# Patient Record
Sex: Male | Born: 1975 | Race: White | Hispanic: No | Marital: Married | Smoking: Former smoker
Health system: Southern US, Community
[De-identification: ages and names within clinical notes are randomized; demographics above are authoritative.]

## PROBLEM LIST (undated history)

## (undated) DIAGNOSIS — T7840XA Allergy, unspecified, initial encounter: Secondary | ICD-10-CM

## (undated) DIAGNOSIS — F419 Anxiety disorder, unspecified: Secondary | ICD-10-CM

## (undated) DIAGNOSIS — K219 Gastro-esophageal reflux disease without esophagitis: Secondary | ICD-10-CM

## (undated) HISTORY — DX: Gastro-esophageal reflux disease without esophagitis: K21.9

## (undated) HISTORY — DX: Allergy, unspecified, initial encounter: T78.40XA

## (undated) HISTORY — PX: FRACTURE SURGERY: SHX138

## (undated) HISTORY — DX: Anxiety disorder, unspecified: F41.9

---

## 2015-09-18 ENCOUNTER — Ambulatory Visit: Payer: Self-pay | Admitting: Physician Assistant

## 2015-09-21 ENCOUNTER — Ambulatory Visit (INDEPENDENT_AMBULATORY_CARE_PROVIDER_SITE_OTHER): Payer: BLUE CROSS/BLUE SHIELD | Admitting: Physician Assistant

## 2015-09-21 ENCOUNTER — Encounter: Payer: Self-pay | Admitting: Physician Assistant

## 2015-09-21 VITALS — BP 127/70 | HR 89 | Ht 67.0 in | Wt 208.0 lb

## 2015-09-21 DIAGNOSIS — K219 Gastro-esophageal reflux disease without esophagitis: Secondary | ICD-10-CM

## 2015-09-21 DIAGNOSIS — K649 Unspecified hemorrhoids: Secondary | ICD-10-CM

## 2015-09-21 DIAGNOSIS — F410 Panic disorder [episodic paroxysmal anxiety] without agoraphobia: Secondary | ICD-10-CM

## 2015-09-21 DIAGNOSIS — J302 Other seasonal allergic rhinitis: Secondary | ICD-10-CM

## 2015-09-21 DIAGNOSIS — J309 Allergic rhinitis, unspecified: Secondary | ICD-10-CM | POA: Insufficient documentation

## 2015-09-21 DIAGNOSIS — Z8709 Personal history of other diseases of the respiratory system: Secondary | ICD-10-CM

## 2015-09-21 MED ORDER — OMEPRAZOLE 40 MG PO CPDR: 40.0000 mg | DELAYED_RELEASE_CAPSULE | Freq: Every day | ORAL | Status: DC

## 2015-09-21 MED ORDER — ALPRAZOLAM 1 MG PO TABS: 1.0000 mg | ORAL_TABLET | Freq: Every day | ORAL | Status: DC

## 2015-09-21 MED ORDER — LEVOCETIRIZINE DIHYDROCHLORIDE 5 MG PO TABS: 5.0000 mg | ORAL_TABLET | Freq: Every evening | ORAL | Status: DC

## 2015-09-21 MED ORDER — OLOPATADINE HCL 0.1 % OP SOLN: 1.0000 [drp] | Freq: Two times a day (BID) | OPHTHALMIC | Status: DC

## 2015-09-21 NOTE — Progress Notes (Signed)
   Subjective:    Patient ID: Luke KillSteven Villarreal, male    DOB: 11/15/1975, 40 y.o.   MRN: [id number]  HPI Pt is a 40 yo male who presents to the clinic to establish care.  He needs medication refills.   He would like new rx for allergic eye drops and allergy pill. He has tried OTC zyrtec, claritin and allegra.  He does discussed hemorrhoids. Had colonoscopy this year and banding done. Not bothering him today.   Panic attacks started about 1 year ago. Xanax at bedtime controlls symptoms.    .. Active Ambulatory Problems    Diagnosis Date Noted  . Gastroesophageal reflux disease without esophagitis 09/21/2015  . Rhinitis, allergic 09/21/2015  . Panic attacks 09/21/2015  . Seasonal allergies 09/21/2015  . Hemorrhoid 09/21/2015  . History of asthma 09/21/2015   Resolved Ambulatory Problems    Diagnosis Date Noted  . No Resolved Ambulatory Problems   No Additional Past Medical History   .Marland Kitchen. Family History  Problem Relation Age of Onset  . Hyperlipidemia Mother   . Hypertension Mother   . Hypertension Father   . Hyperlipidemia Father    .Marland Kitchen. Social History   Social History  . Marital Status: Married    Spouse Name: N/A  . Number of Children: N/A  . Years of Education: N/A   Occupational History  . Not on file.   Social History Main Topics  . Smoking status: Former Games developermoker  . Smokeless tobacco: Not on file  . Alcohol Use: No  . Drug Use: Not on file  . Sexual Activity: Yes   Other Topics Concern  . Not on file   Social History Narrative  . No narrative on file      Review of Systems  All other systems reviewed and are negative.      Objective:   Physical Exam  Constitutional: He is oriented to person, place, and time. He appears well-developed and well-nourished.  Obese.   HENT:  Head: Normocephalic and atraumatic.  Cardiovascular: Normal rate, regular rhythm and normal heart sounds.   Pulmonary/Chest: Effort normal and breath sounds normal.  Neurological:  He is alert and oriented to person, place, and time.  Skin: Skin is dry.  Psychiatric: He has a normal mood and affect. His behavior is normal.          Assessment & Plan:  Panic attacks- xanax at bedtime refilled.   Allergies/allergic conjunctivitis/hx of asthma- will try xyzal, patanol drops for allergies.   GERD- refilled prilosec.   Hemorrhoids- managed by GI. Hx of surgery. Has symptomatic care at home. Controlled today.

## 2015-09-22 ENCOUNTER — Encounter: Payer: Self-pay | Admitting: Physician Assistant

## 2015-09-30 ENCOUNTER — Ambulatory Visit: Payer: BLUE CROSS/BLUE SHIELD | Admitting: Family Medicine

## 2015-11-25 ENCOUNTER — Encounter: Payer: Self-pay | Admitting: Physician Assistant

## 2015-11-25 ENCOUNTER — Ambulatory Visit (INDEPENDENT_AMBULATORY_CARE_PROVIDER_SITE_OTHER): Payer: BLUE CROSS/BLUE SHIELD

## 2015-11-25 ENCOUNTER — Ambulatory Visit (INDEPENDENT_AMBULATORY_CARE_PROVIDER_SITE_OTHER): Payer: BLUE CROSS/BLUE SHIELD | Admitting: Physician Assistant

## 2015-11-25 VITALS — BP 122/73 | HR 77 | Ht 67.0 in | Wt 211.0 lb

## 2015-11-25 DIAGNOSIS — R319 Hematuria, unspecified: Secondary | ICD-10-CM | POA: Diagnosis not present

## 2015-11-25 DIAGNOSIS — R102 Pelvic and perineal pain: Secondary | ICD-10-CM | POA: Insufficient documentation

## 2015-11-25 DIAGNOSIS — M4317 Spondylolisthesis, lumbosacral region: Secondary | ICD-10-CM

## 2015-11-25 DIAGNOSIS — M546 Pain in thoracic spine: Secondary | ICD-10-CM

## 2015-11-25 DIAGNOSIS — Z13220 Encounter for screening for lipoid disorders: Secondary | ICD-10-CM

## 2015-11-25 DIAGNOSIS — R109 Unspecified abdominal pain: Secondary | ICD-10-CM

## 2015-11-25 DIAGNOSIS — K649 Unspecified hemorrhoids: Secondary | ICD-10-CM

## 2015-11-25 DIAGNOSIS — R1031 Right lower quadrant pain: Secondary | ICD-10-CM

## 2015-11-25 DIAGNOSIS — M549 Dorsalgia, unspecified: Secondary | ICD-10-CM

## 2015-11-25 DIAGNOSIS — G5603 Carpal tunnel syndrome, bilateral upper limbs: Secondary | ICD-10-CM | POA: Insufficient documentation

## 2015-11-25 DIAGNOSIS — R5383 Other fatigue: Secondary | ICD-10-CM

## 2015-11-25 LAB — POCT URINALYSIS DIPSTICK
Bilirubin, UA: NEGATIVE
Glucose, UA: NEGATIVE
Ketones, UA: NEGATIVE
Nitrite, UA: NEGATIVE
Protein, UA: NEGATIVE
Spec Grav, UA: 1.025
Urobilinogen, UA: 0.2
pH, UA: 6

## 2015-11-25 MED ORDER — MELOXICAM 15 MG PO TABS: 15.0000 mg | ORAL_TABLET | Freq: Every day | ORAL | 1 refills | Status: DC

## 2015-11-25 MED ORDER — KETOROLAC TROMETHAMINE 60 MG/2ML IM SOLN
60.0000 mg | Freq: Once | INTRAMUSCULAR | Status: AC
  Administered 2015-11-25: 60 mg via INTRAMUSCULAR

## 2015-11-25 MED ORDER — CYCLOBENZAPRINE HCL 10 MG PO TABS: 10.0000 mg | ORAL_TABLET | Freq: Three times a day (TID) | ORAL | 0 refills | Status: DC | PRN

## 2015-11-25 NOTE — Progress Notes (Addendum)
   Subjective:    Patient ID: Luke Villarreal, male    DOB: 1975-10-30, 40 y.o.   MRN: [id number]  HPI Pt is a 40 year old male that presents with complaints of back pain and hand swelling. He reports upper back pain that has been constant for the last month. He states the pain is made worse with movement but does not radiate. He has not tried anything for the pain. He reports right groin pain for two weeks that radiates to his toes. He denies any injury, trauma, fall, heavy lifting.   He complains of hand numbness and tingling for the last month. He has a job that requires repetitive motion. Seems to be a little worse in the morning.   He also complains of chronic fatigue. He states that he sleeps at night and is not waking up after falling asleep. He has been told that he snores. He reports that he never fills rested.    Review of Systems  All other systems reviewed and are negative.      Objective:   Physical Exam  Constitutional: He is oriented to person, place, and time. He appears well-developed and well-nourished.  Neck: Normal range of motion. Neck supple.  Cardiovascular: Normal rate, regular rhythm and normal heart sounds.   Pulmonary/Chest: Effort normal and breath sounds normal.  No CVA tenderness noted  Abdominal: Soft. Bowel sounds are normal. There is no tenderness.  Musculoskeletal: Normal range of motion.  No spine tenderness to palpation. Bilateral point tenderness over upper back muscles.  Full ROM.   Neurological: He is alert and oriented to person, place, and time. He has normal reflexes.  Positive Phalen's test and Tinel's sign    Skin: Skin is warm and dry.  Psychiatric: He has a normal mood and affect. His behavior is normal.          Assessment & Plan:  Acute upper back pain- Encourage therapeutic massage and use of Biofreeze. Start NSAID, Mobic, to decrease inflammation. Given Flexeril to use a bedtime. Handout on upper body exercise given today. Toradol  IM 60 mg given in clinic today. Follow up as needed if symptoms persist or worsen.    Bilateral carpal tunnel syndrome- Given hand splints for use at night. Will consider nerve conduction studies if symptoms fail to improve. mobic to start daily.   Chronic fatigue/snoring- STOP BANG was strongly suggestive of high risk sleep apnea. Will order sleep study. Screening CBC, TSH, CMP, Vitamin D, Vitamin B12, lipids, and ferrtin ordered.    Groin pain- Lumbar x-ray ordered. Encouraged heat and ice for symptomatic relief.   Hematuria- Unlikely due to kidney stone or infectious cause. Will repeat UA in 4 weeks.

## 2015-11-25 NOTE — Progress Notes (Addendum)
   Subjective:    Patient ID: Luke Villarreal, male    DOB: [birth year], 40 y.o.   MRN: [id number]  HPI    Review of Systems     Objective:   Physical Exam        Assessment & Plan:

## 2015-11-25 NOTE — Patient Instructions (Addendum)
Upper back exercises.  mobic daily.  Wrist splints at night.  Get labs.  Will order sleep study.   Carpal Tunnel Syndrome Carpal tunnel syndrome is a condition that causes pain in your hand and arm. The carpal tunnel is a narrow area located on the palm side of your wrist. Repeated wrist motion or certain diseases may cause swelling within the tunnel. This swelling pinches the main nerve in the wrist (median nerve). CAUSES  This condition may be caused by:   Repeated wrist motions.  Wrist injuries.  Arthritis.  A cyst or tumor in the carpal tunnel.  Fluid buildup during pregnancy. Sometimes the cause of this condition is not known.  RISK FACTORS This condition is more likely to develop in:   People who have jobs that cause them to repeatedly move their wrists in the same motion, such as butchers and cashiers.  Women.  People with certain conditions, such as:  Diabetes.  Obesity.  An underactive thyroid (hypothyroidism).  Kidney failure. SYMPTOMS  Symptoms of this condition include:   A tingling feeling in your fingers, especially in your thumb, index, and middle fingers.  Tingling or numbness in your hand.  An aching feeling in your entire arm, especially when your wrist and elbow are bent for long periods of time.  Wrist pain that goes up your arm to your shoulder.  Pain that goes down into your palm or fingers.  A weak feeling in your hands. You may have trouble grabbing and holding items. Your symptoms may feel worse during the night.  DIAGNOSIS  This condition is diagnosed with a medical history and physical exam. You may also have tests, including:   An electromyogram (EMG). This test measures electrical signals sent by your nerves into the muscles.  X-rays. TREATMENT  Treatment for this condition includes:  Lifestyle changes. It is important to stop doing or modify the activity that caused your condition.  Physical or occupational  therapy.  Medicines for pain and inflammation. This may include medicine that is injected into your wrist.  A wrist splint.  Surgery. HOME CARE INSTRUCTIONS  If You Have a Splint:  Wear it as told by your health care provider. Remove it only as told by your health care provider.  Loosen the splint if your fingers become numb and tingle, or if they turn cold and blue.  Keep the splint clean and dry. General Instructions  Take over-the-counter and prescription medicines only as told by your health care provider.  Rest your wrist from any activity that may be causing your pain. If your condition is work related, talk to your employer about changes that can be made, such as getting a wrist pad to use while typing.  If directed, apply ice to the painful area:  Put ice in a plastic bag.  Place a towel between your skin and the bag.  Leave the ice on for 20 minutes, 2-3 times per day.  Keep all follow-up visits as told by your health care provider. This is important.  Do any exercises as told by your health care provider, physical therapist, or occupational therapist. SEEK MEDICAL CARE IF:   You have new symptoms.  Your pain is not controlled with medicines.  Your symptoms get worse.   This information is not intended to replace advice given to you by your health care provider. Make sure you discuss any questions you have with your health care provider.   Document Released: 04/15/2000 Document Revised: 01/07/2015 Document Reviewed:  09/03/2014 Elsevier Interactive Patient Education [employer]2016 Elsevier Inc.

## 2015-11-26 ENCOUNTER — Encounter: Payer: Self-pay | Admitting: Physician Assistant

## 2015-11-26 DIAGNOSIS — E786 Lipoprotein deficiency: Secondary | ICD-10-CM | POA: Insufficient documentation

## 2015-11-26 DIAGNOSIS — E781 Pure hyperglyceridemia: Secondary | ICD-10-CM | POA: Insufficient documentation

## 2015-11-26 DIAGNOSIS — M4310 Spondylolisthesis, site unspecified: Secondary | ICD-10-CM | POA: Insufficient documentation

## 2015-11-26 LAB — COMPLETE METABOLIC PANEL WITH GFR
ALBUMIN: 4.1 g/dL (ref 3.6–5.1)
ALK PHOS: 47 U/L (ref 40–115)
ALT: 18 U/L (ref 9–46)
AST: 16 U/L (ref 10–40)
BUN: 13 mg/dL (ref 7–25)
CHLORIDE: 103 mmol/L (ref 98–110)
CO2: 27 mmol/L (ref 20–31)
Calcium: 8.9 mg/dL (ref 8.6–10.3)
Creat: 1.01 mg/dL (ref 0.60–1.35)
GFR, Est African American: >89 mL/min (ref >=60)
GLUCOSE: 90 mg/dL (ref 65–99)
POTASSIUM: 4.2 mmol/L (ref 3.5–5.3)
SODIUM: 140 mmol/L (ref 135–146)
Total Bilirubin: 0.7 mg/dL (ref 0.2–1.2)
Total Protein: 7.2 g/dL (ref 6.1–8.1)

## 2015-11-26 LAB — CBC WITH DIFFERENTIAL/PLATELET
BASOS ABS: 0 {cells}/uL (ref 0–200)
Basophils Relative: 0 %
EOS PCT: 2 %
Eosinophils Absolute: 132 cells/uL (ref 15–500)
HEMATOCRIT: 44.2 % (ref 38.5–50.0)
HEMOGLOBIN: 14.9 g/dL (ref 13.2–17.1)
LYMPHS ABS: 1782 {cells}/uL (ref 850–3900)
Lymphocytes Relative: 27 %
MCH: 29.9 pg (ref 27.0–33.0)
MCHC: 33.7 g/dL (ref 32.0–36.0)
MCV: 88.8 fL (ref 80.0–100.0)
MPV: 10.1 fL (ref 7.5–12.5)
Monocytes Absolute: 792 cells/uL (ref 200–950)
Monocytes Relative: 12 %
NEUTROS ABS: 3894 {cells}/uL (ref 1500–7800)
Neutrophils Relative %: 59 %
Platelets: 222 10*3/uL (ref 140–400)
RBC: 4.98 MIL/uL (ref 4.20–5.80)
RDW: 14 % (ref 11.0–15.0)
WBC: 6.6 10*3/uL (ref 3.8–10.8)

## 2015-11-26 LAB — TSH: TSH: 2.22 mIU/L (ref 0.40–4.50)

## 2015-11-26 LAB — LIPID PANEL
Cholesterol: 201 mg/dL — ABNORMAL HIGH (ref 125–200)
HDL: 36 mg/dL — AB (ref >=40)
LDL CALC: 100 mg/dL (ref <130)
TRIGLYCERIDES: 327 mg/dL — AB (ref <150)
Total CHOL/HDL Ratio: 5.6 Ratio — ABNORMAL HIGH (ref <=5.0)
VLDL: 65 mg/dL — AB (ref <30)

## 2015-11-26 LAB — FERRITIN: Ferritin: 122 ng/mL (ref 20–345)

## 2015-11-26 LAB — VITAMIN B12: Vitamin B-12: 390 pg/mL (ref 200–1100)

## 2015-11-26 LAB — VITAMIN D 25 HYDROXY (VIT D DEFICIENCY, FRACTURES): VIT D 25 HYDROXY: 31 ng/mL (ref 30–100)

## 2015-11-27 LAB — URINE CULTURE: Organism ID, Bacteria: NO GROWTH

## 2015-12-02 ENCOUNTER — Ambulatory Visit: Payer: BLUE CROSS/BLUE SHIELD | Admitting: Physician Assistant

## 2015-12-23 ENCOUNTER — Ambulatory Visit: Payer: BLUE CROSS/BLUE SHIELD | Admitting: Physician Assistant

## 2015-12-28 ENCOUNTER — Encounter: Payer: Self-pay | Admitting: Physician Assistant

## 2015-12-28 ENCOUNTER — Ambulatory Visit (INDEPENDENT_AMBULATORY_CARE_PROVIDER_SITE_OTHER): Payer: BLUE CROSS/BLUE SHIELD | Admitting: Physician Assistant

## 2015-12-28 VITALS — BP 133/77 | HR 85 | Ht 67.0 in | Wt 214.0 lb

## 2015-12-28 DIAGNOSIS — E559 Vitamin D deficiency, unspecified: Secondary | ICD-10-CM

## 2015-12-28 DIAGNOSIS — R5383 Other fatigue: Secondary | ICD-10-CM | POA: Diagnosis not present

## 2015-12-28 DIAGNOSIS — E538 Deficiency of other specified B group vitamins: Secondary | ICD-10-CM

## 2015-12-28 MED ORDER — CYCLOBENZAPRINE HCL 10 MG PO TABS: 10.0000 mg | ORAL_TABLET | Freq: Three times a day (TID) | ORAL | 0 refills | Status: DC | PRN

## 2015-12-28 MED ORDER — CYANOCOBALAMIN 1000 MCG/ML IJ SOLN
1000.0000 ug | Freq: Once | INTRAMUSCULAR | Status: AC
  Administered 2015-12-28: 1000 ug via INTRAMUSCULAR

## 2015-12-30 ENCOUNTER — Encounter: Payer: Self-pay | Admitting: Physician Assistant

## 2015-12-30 NOTE — Progress Notes (Signed)
   Subjective:    Patient ID: Luke Villarreal, male    DOB: 07/07/1975, 40 y.o.   MRN: [id number]  HPI Pt is a 40 yo male With a history of psych issues(anxiety, bipolar, panic)Who presents to the clinic to follow-up after hospital visit on 12/14/2015 for weakness and no energy. Due to his history of tick bite they tested for documented spotted fever, Lyme's disease, CMP, CBC and EKG. Her labs and testing were negative. They did go ahead and give him doxycycline to take. He has been on B12 for about a week. He does seem to be fill in some better since the hospital visit and starting B12. He is back to work.   Review of Systems See HPI.     Objective:   Physical Exam  Constitutional: He is oriented to person, place, and time. He appears well-developed and well-nourished.  HENT:  Head: Normocephalic and atraumatic.  Cardiovascular: Normal rate, regular rhythm and normal heart sounds.   Pulmonary/Chest: Effort normal and breath sounds normal.  Neurological: He is alert and oriented to person, place, and time.  Psychiatric: He has a normal mood and affect. His behavior is normal.          Assessment & Plan:  No energy/weakness- seems to be improving some with b12 orally. Only done for 1 week. Will try injection. Reassured labs were normal at ED. Discussed checking testosterone at next blood draw.   b12 deficiency/vitamin D insufficiency- will recheck in 4 weeks. b12 shot given in office today to see if he gets more benefit. Continue vitamin D.

## 2016-01-29 ENCOUNTER — Ambulatory Visit (INDEPENDENT_AMBULATORY_CARE_PROVIDER_SITE_OTHER): Payer: BLUE CROSS/BLUE SHIELD | Admitting: Family Medicine

## 2016-01-29 VITALS — BP 141/93 | HR 86

## 2016-01-29 DIAGNOSIS — R5383 Other fatigue: Secondary | ICD-10-CM | POA: Diagnosis not present

## 2016-01-29 DIAGNOSIS — E538 Deficiency of other specified B group vitamins: Secondary | ICD-10-CM

## 2016-01-29 MED ORDER — CYANOCOBALAMIN 1000 MCG/ML IJ SOLN
1000.0000 ug | Freq: Once | INTRAMUSCULAR | Status: AC
  Administered 2016-01-29: 1000 ug via INTRAMUSCULAR

## 2016-01-29 NOTE — Progress Notes (Signed)
Luke Villarreal presents to the clinic for his monthly B 12 injection.  Denies negative side effects from last injection. He stated that he noticed a huge improvement his energy levels. Also he got his lab work done today. Pt tolerated injection well in the left deltoid.  Pt advised to make next appointment in 4 weeks. Pt verbalized understanding. -EMH/RMA

## 2016-01-29 NOTE — Progress Notes (Signed)
Agree with above.  Luke Metivier, MD  

## 2016-01-30 LAB — TESTOSTERONE: Testosterone: 186 ng/dL — ABNORMAL LOW (ref 250–827)

## 2016-01-30 LAB — VITAMIN B12: Vitamin B-12: 379 pg/mL (ref 200–1100)

## 2016-01-30 LAB — VITAMIN D 25 HYDROXY (VIT D DEFICIENCY, FRACTURES): Vit D, 25-Hydroxy: 27 ng/mL — ABNORMAL LOW (ref 30–100)

## 2016-02-26 ENCOUNTER — Ambulatory Visit: Payer: BLUE CROSS/BLUE SHIELD

## 2016-03-07 ENCOUNTER — Ambulatory Visit (INDEPENDENT_AMBULATORY_CARE_PROVIDER_SITE_OTHER): Payer: BLUE CROSS/BLUE SHIELD | Admitting: Physician Assistant

## 2016-03-07 ENCOUNTER — Encounter: Payer: Self-pay | Admitting: Physician Assistant

## 2016-03-07 ENCOUNTER — Other Ambulatory Visit: Payer: Self-pay

## 2016-03-07 VITALS — BP 145/82 | HR 65 | Ht 67.0 in | Wt 223.0 lb

## 2016-03-07 DIAGNOSIS — K2100 Gastro-esophageal reflux disease with esophagitis, without bleeding: Secondary | ICD-10-CM

## 2016-03-07 DIAGNOSIS — F411 Generalized anxiety disorder: Secondary | ICD-10-CM

## 2016-03-07 DIAGNOSIS — E291 Testicular hypofunction: Secondary | ICD-10-CM | POA: Diagnosis not present

## 2016-03-07 DIAGNOSIS — J301 Allergic rhinitis due to pollen: Secondary | ICD-10-CM

## 2016-03-07 DIAGNOSIS — R7989 Other specified abnormal findings of blood chemistry: Secondary | ICD-10-CM

## 2016-03-07 DIAGNOSIS — E538 Deficiency of other specified B group vitamins: Secondary | ICD-10-CM

## 2016-03-07 DIAGNOSIS — H1013 Acute atopic conjunctivitis, bilateral: Secondary | ICD-10-CM

## 2016-03-07 MED ORDER — OLOPATADINE HCL 0.1 % OP SOLN: 1.0000 [drp] | Freq: Two times a day (BID) | OPHTHALMIC | 11 refills | Status: DC

## 2016-03-07 MED ORDER — "NEEDLE (DISP) 25G X 1"" MISC": 0 refills | Status: DC

## 2016-03-07 MED ORDER — CYANOCOBALAMIN 1000 MCG/ML IJ SOLN
1000.0000 ug | Freq: Once | INTRAMUSCULAR | Status: AC
  Administered 2016-03-07: 1000 ug via INTRAMUSCULAR

## 2016-03-07 MED ORDER — ALPRAZOLAM 1 MG PO TABS: 1.0000 mg | ORAL_TABLET | Freq: Every day | ORAL | 5 refills | Status: DC

## 2016-03-07 MED ORDER — CYANOCOBALAMIN 1000 MCG/ML IJ SOLN: 1000.0000 ug | INTRAMUSCULAR | 0 refills | Status: DC

## 2016-03-07 MED ORDER — "SYRINGE 20G X 1"" 3 ML MISC": 0 refills | Status: DC

## 2016-03-07 MED ORDER — OMEPRAZOLE 40 MG PO CPDR: 40.0000 mg | DELAYED_RELEASE_CAPSULE | Freq: Every day | ORAL | 4 refills | Status: DC

## 2016-03-07 MED ORDER — "NEEDLE (DISP) 18G X 1"" MISC": 0 refills | Status: DC

## 2016-03-08 ENCOUNTER — Encounter: Payer: Self-pay | Admitting: Physician Assistant

## 2016-03-08 ENCOUNTER — Other Ambulatory Visit: Payer: Self-pay | Admitting: Physician Assistant

## 2016-03-08 ENCOUNTER — Telehealth: Payer: Self-pay | Admitting: Physician Assistant

## 2016-03-08 DIAGNOSIS — E291 Testicular hypofunction: Secondary | ICD-10-CM | POA: Insufficient documentation

## 2016-03-08 LAB — PSA: PSA: 2.1 ng/mL (ref <=4.0)

## 2016-03-08 LAB — VITAMIN B12

## 2016-03-08 LAB — TESTOSTERONE: TESTOSTERONE: 197 ng/dL — AB (ref 250–827)

## 2016-03-08 MED ORDER — TESTOSTERONE 50 MG/5GM (1%) TD GEL: 5.0000 g | Freq: Every day | TRANSDERMAL | 1 refills | Status: DC

## 2016-03-08 MED ORDER — LEVOCETIRIZINE DIHYDROCHLORIDE 5 MG PO TABS: 5.0000 mg | ORAL_TABLET | Freq: Every evening | ORAL | 4 refills | Status: DC

## 2016-03-08 NOTE — Progress Notes (Signed)
   Subjective:    Patient ID: Luke Villarreal, male    DOB: 04/09/1976, 40 y.o.   MRN: [id number]  HPI Pt is a 40 yo male who presents to the clinic for b12 shot and to discuss labs.   He does need refill on GERD, anxiety, allergy medications. He denies any problems or concerns.   Review of Systems  All other systems reviewed and are negative.      Objective:   Physical Exam  Constitutional: He is oriented to person, place, and time. He appears well-developed and well-nourished.  HENT:  Head: Normocephalic and atraumatic.  Cardiovascular: Normal rate, regular rhythm and normal heart sounds.   Pulmonary/Chest: Effort normal and breath sounds normal.  Neurological: He is alert and oriented to person, place, and time.  Psychiatric: He has a normal mood and affect. His behavior is normal.          Assessment & Plan:  Marland Kitchen.Marland Kitchen.Diagnoses and all orders for this visit:  B12 deficiency -     cyanocobalamin ((VITAMIN B-12)) injection 1,000 mcg; Inject 1 mL (1,000 mcg total) into the muscle once. -     cyanocobalamin (,VITAMIN B-12,) 1000 MCG/ML injection; Inject 1 mL (1,000 mcg total) into the muscle every 30 (thirty) days. -     B12  Decreased testosterone level -     Testosterone -     PSA -     testosterone (ANDROGEL) 50 MG/5GM (1%) GEL; Place 5 g onto the skin daily.  Chronic allergic rhinitis due to pollen, unspecified seasonality -     olopatadine (PATANOL) 0.1 % ophthalmic solution; Place 1 drop into both eyes 2 (two) times daily. -     levocetirizine (XYZAL) 5 MG tablet; Take 1 tablet (5 mg total) by mouth every evening.  Gastroesophageal reflux disease with esophagitis -     omeprazole (PRILOSEC) 40 MG capsule; Take 1 capsule (40 mg total) by mouth daily.  Anxiety state -     ALPRAZolam (XANAX) 1 MG tablet; Take 1 tablet (1 mg total) by mouth at bedtime.  Allergic conjunctivitis of both eyes -     olopatadine (PATANOL) 0.1 % ophthalmic solution; Place 1 drop into both eyes 2  (two) times daily.   Discussed with patient will recheck testosterone and if decreased will call in androgel. Discussed topical options and that is the one he chose. Discussed side effects and risks.   Will recheck b12 after 3 b12 injections.

## 2016-03-08 NOTE — Telephone Encounter (Signed)
See result note. Androgel and zyrtec sent.

## 2016-03-08 NOTE — Telephone Encounter (Signed)
Pts wife called saying that her husband was prescribed Zyrtec and testosterone gel but the prescriptions were not called into the pharmacy. Please advise. Thanks!

## 2016-03-30 ENCOUNTER — Telehealth: Payer: Self-pay | Admitting: *Deleted

## 2016-03-30 NOTE — Telephone Encounter (Signed)
Yes

## 2016-03-30 NOTE — Telephone Encounter (Signed)
Im getting ready to do a prior authorization on testosterone gel for this patient. The pharm sent a request for the androgel pump(20.25mg /act) 1.62% and the actual prescription was written for androgel (50mg /5gm) 1% gel. I believe the way that the prescription was sent originally will get the patient testosterone packets which they no longer make.  Can I go ahead and auth for androgel 1.62% gel pump?

## 2016-04-01 NOTE — Telephone Encounter (Signed)
PA submitted Key: [id number]

## 2016-04-05 NOTE — Telephone Encounter (Signed)
PA approved from 04/01/16-05/01/2038. Called patient's # listed as cell and his wife answered. She was aware that patient was waiting on testosterone PA. Advised her that it had been approved. Since pharmacy doesn't open until 9 am faxed over approval letter to pharmacy.

## 2016-04-22 ENCOUNTER — Ambulatory Visit: Payer: BLUE CROSS/BLUE SHIELD | Admitting: Physician Assistant

## 2016-04-28 ENCOUNTER — Other Ambulatory Visit: Payer: Self-pay | Admitting: *Deleted

## 2016-04-28 MED ORDER — CYCLOBENZAPRINE HCL 10 MG PO TABS: ORAL_TABLET | ORAL | 0 refills | Status: DC

## 2016-05-30 ENCOUNTER — Other Ambulatory Visit: Payer: Self-pay | Admitting: Physician Assistant

## 2016-06-01 ENCOUNTER — Other Ambulatory Visit: Payer: Self-pay | Admitting: *Deleted

## 2016-06-01 MED ORDER — MELOXICAM 15 MG PO TABS: 15.0000 mg | ORAL_TABLET | Freq: Every day | ORAL | 1 refills | Status: DC

## 2016-06-01 MED ORDER — CYCLOBENZAPRINE HCL 10 MG PO TABS: ORAL_TABLET | ORAL | 0 refills | Status: DC

## 2016-07-06 ENCOUNTER — Other Ambulatory Visit: Payer: Self-pay | Admitting: Physician Assistant

## 2016-07-06 MED ORDER — HYDROCORTISONE 2.5 % RE CREA: 1.0000 "application " | TOPICAL_CREAM | Freq: Two times a day (BID) | RECTAL | 5 refills | Status: DC

## 2016-09-05 ENCOUNTER — Other Ambulatory Visit: Payer: Self-pay | Admitting: Physician Assistant

## 2016-09-05 DIAGNOSIS — F411 Generalized anxiety disorder: Secondary | ICD-10-CM

## 2016-09-05 MED ORDER — ALPRAZOLAM 1 MG PO TABS: 1.0000 mg | ORAL_TABLET | Freq: Every day | ORAL | 0 refills | Status: DC

## 2016-09-19 ENCOUNTER — Encounter: Payer: Self-pay | Admitting: Physician Assistant

## 2016-09-19 ENCOUNTER — Ambulatory Visit (INDEPENDENT_AMBULATORY_CARE_PROVIDER_SITE_OTHER): Payer: BLUE CROSS/BLUE SHIELD | Admitting: Physician Assistant

## 2016-09-19 VITALS — BP 136/91 | HR 80 | Ht 67.0 in | Wt 227.0 lb

## 2016-09-19 DIAGNOSIS — F411 Generalized anxiety disorder: Secondary | ICD-10-CM | POA: Diagnosis not present

## 2016-09-19 DIAGNOSIS — E291 Testicular hypofunction: Secondary | ICD-10-CM

## 2016-09-19 DIAGNOSIS — Z79899 Other long term (current) drug therapy: Secondary | ICD-10-CM | POA: Diagnosis not present

## 2016-09-19 DIAGNOSIS — E538 Deficiency of other specified B group vitamins: Secondary | ICD-10-CM

## 2016-09-19 DIAGNOSIS — F410 Panic disorder [episodic paroxysmal anxiety] without agoraphobia: Secondary | ICD-10-CM | POA: Diagnosis not present

## 2016-09-19 DIAGNOSIS — J301 Allergic rhinitis due to pollen: Secondary | ICD-10-CM | POA: Diagnosis not present

## 2016-09-19 MED ORDER — MONTELUKAST SODIUM 10 MG PO TABS
10.0000 mg | ORAL_TABLET | Freq: Every day | ORAL | 5 refills | Status: DC
Start: 2016-09-19 — End: 2018-06-15

## 2016-09-19 MED ORDER — ALPRAZOLAM 1 MG PO TABS: 1.0000 mg | ORAL_TABLET | Freq: Every day | ORAL | 5 refills | Status: DC

## 2016-09-19 MED ORDER — FLUTICASONE PROPIONATE 50 MCG/ACT NA SUSP: 2.0000 | Freq: Every day | NASAL | 6 refills | Status: DC

## 2016-09-19 MED ORDER — AMBULATORY NON FORMULARY MEDICATION: 0 refills | Status: DC

## 2016-09-19 MED ORDER — TESTOSTERONE CYPIONATE 200 MG/ML IM SOLN
200.0000 mg | Freq: Once | INTRAMUSCULAR | Status: AC
  Administered 2016-09-19: 200 mg via INTRAMUSCULAR

## 2016-09-19 MED ORDER — TESTOSTERONE CYPIONATE 200 MG/ML IM SOLN: 200.0000 mg | INTRAMUSCULAR | 0 refills | Status: DC

## 2016-09-19 MED ORDER — CYCLOBENZAPRINE HCL 10 MG PO TABS: ORAL_TABLET | ORAL | 0 refills | Status: DC

## 2016-09-19 NOTE — Patient Instructions (Signed)
1 month for labs.

## 2016-09-20 DIAGNOSIS — F411 Generalized anxiety disorder: Secondary | ICD-10-CM | POA: Insufficient documentation

## 2016-09-20 DIAGNOSIS — E538 Deficiency of other specified B group vitamins: Secondary | ICD-10-CM | POA: Insufficient documentation

## 2016-09-20 NOTE — Progress Notes (Signed)
   Subjective:    Patient ID: Luke Villarreal, male    DOB: 06/01/1975, 41 y.o.   MRN: [id number]  HPI Pt is a 41 yo male who presents to the clinic for 6 month follow up for medication refills.   Pt needs refill on xanax he uses at least once a day. Does not want to try a daily to help prevent anxiety.   He has b12 deficiency last recheck b12 level elevated. Will recheck and see what level we are at.   Allergies are terrible. Taking xyxal with little relief.   Male hypogonadism- has gel but not using regularly. Gel seems to be really messy and now he has a small child in home. He would like to try shots.     Review of Systems  All other systems reviewed and are negative.      Objective:   Physical Exam  Constitutional: He is oriented to person, place, and time. He appears well-developed and well-nourished.  HENT:  Head: Normocephalic and atraumatic.  Cardiovascular: Normal rate, regular rhythm and normal heart sounds.   Pulmonary/Chest: Effort normal and breath sounds normal. He has no wheezes.  Neurological: He is alert and oriented to person, place, and time.  Psychiatric: He has a normal mood and affect. His behavior is normal.          Assessment & Plan:  Marland Kitchen.Marland Kitchen.Diagnoses and all orders for this visit:  Anxiety state -     ALPRAZolam (XANAX) 1 MG tablet; Take 1 tablet (1 mg total) by mouth at bedtime. -     COMPLETE METABOLIC PANEL WITH GFR  B12 deficiency -     B12  Male hypogonadism -     Testosterone -     PSA -     CBC with Differential/Platelet -     testosterone cypionate (DEPOTESTOSTERONE CYPIONATE) 200 MG/ML injection; Inject 1 mL (200 mg total) into the muscle every 14 (fourteen) days. -     testosterone cypionate (DEPOTESTOSTERONE CYPIONATE) injection 200 mg; Inject 1 mL (200 mg total) into the muscle once.  Medication management -     B12 -     Testosterone -     COMPLETE METABOLIC PANEL WITH GFR  Seasonal allergic rhinitis due to pollen -      montelukast (SINGULAIR) 10 MG tablet; Take 1 tablet (10 mg total) by mouth at bedtime. -     fluticasone (FLONASE) 50 MCG/ACT nasal spray; Place 2 sprays into both nostrils daily.  Panic attacks  Other orders -     cyclobenzaprine (FLEXERIL) 10 MG tablet; take 1 tablet by mouth three times a day if needed FOR MUSCLE SPASMS -     AMBULATORY NON FORMULARY MEDICATION; 22g 1.5 inch needles -     AMBULATORY NON FORMULARY MEDICATION; 18 gauge 1.5 inch blunt tip needles for drawing up testosterone only.  Do not use these to inject.   Will replace gel due to small child in household and no efficacy to shot. Recheck testosterone in 1 month. For allergies added singulair and flonase to xyzal.

## 2016-10-05 ENCOUNTER — Telehealth: Payer: Self-pay | Admitting: *Deleted

## 2016-10-05 NOTE — Telephone Encounter (Signed)
Pre Authorization sent to cover my meds. [id number]

## 2016-10-10 ENCOUNTER — Ambulatory Visit: Payer: BLUE CROSS/BLUE SHIELD | Admitting: Family Medicine

## 2016-10-10 DIAGNOSIS — Z0189 Encounter for other specified special examinations: Secondary | ICD-10-CM

## 2016-10-10 NOTE — Telephone Encounter (Signed)
Testosterone was denied due to low testosterone lab level not being collected in the early morning. Denial letter sent to scan

## 2016-10-10 NOTE — Telephone Encounter (Signed)
OK, please call pt: Will need to come back in for AM testosterone level.  First thing in AM. Place order as well.

## 2016-10-11 NOTE — Telephone Encounter (Signed)
Wife notified -EH/RMA  

## 2016-10-12 ENCOUNTER — Ambulatory Visit: Payer: BLUE CROSS/BLUE SHIELD

## 2017-03-21 ENCOUNTER — Ambulatory Visit: Payer: BLUE CROSS/BLUE SHIELD | Admitting: Physician Assistant

## 2017-04-12 ENCOUNTER — Ambulatory Visit (INDEPENDENT_AMBULATORY_CARE_PROVIDER_SITE_OTHER): Payer: Self-pay | Admitting: Physician Assistant

## 2017-04-12 ENCOUNTER — Encounter: Payer: Self-pay | Admitting: Physician Assistant

## 2017-04-12 VITALS — BP 141/93 | HR 81 | Wt 230.0 lb

## 2017-04-12 DIAGNOSIS — F411 Generalized anxiety disorder: Secondary | ICD-10-CM

## 2017-04-12 DIAGNOSIS — E538 Deficiency of other specified B group vitamins: Secondary | ICD-10-CM

## 2017-04-12 DIAGNOSIS — K649 Unspecified hemorrhoids: Secondary | ICD-10-CM

## 2017-04-12 MED ORDER — "NEEDLE (DISP) 25G X 1"" MISC": 1 refills | Status: DC

## 2017-04-12 MED ORDER — HYDROCORTISONE 2.5 % RE CREA: 1.0000 "application " | TOPICAL_CREAM | Freq: Two times a day (BID) | RECTAL | 5 refills | Status: DC

## 2017-04-12 MED ORDER — ALPRAZOLAM 1 MG PO TABS: 1.0000 mg | ORAL_TABLET | Freq: Every day | ORAL | 5 refills | Status: DC

## 2017-04-12 MED ORDER — CYANOCOBALAMIN 1000 MCG/ML IJ SOLN: 1000.0000 ug | INTRAMUSCULAR | 0 refills | Status: DC

## 2017-04-16 ENCOUNTER — Encounter: Payer: Self-pay | Admitting: Physician Assistant

## 2017-04-16 NOTE — Progress Notes (Signed)
Subjective:    Patient ID: Luke Villarreal, male    DOB: 11/29/1975, 41 y.o.   MRN: [id number]  HPI Patient is a 41 yo male who presents to the clinic to follow up on anxiety and b12 deficiency.   For anxiety he takes xanax twice a day. He is fairly well controlled on this. He has tried prozac and buspar in the past with side effects and never feels like they help his mood.   b12 has helped with energy. His wife gives him his shot once a month. He feels a difference when he is due for an injection.   He was not able to get testosterone due to losing insurance. He would like to consider this when he gets insurance again.   He does get occasional hemorrhoids and request hemorrhoid cream.   .. Active Ambulatory Problems    Diagnosis Date Noted  . Gastroesophageal reflux disease without esophagitis 09/21/2015  . Rhinitis, allergic 09/21/2015  . Panic attacks 09/21/2015  . Seasonal allergies 09/21/2015  . Hemorrhoid 09/21/2015  . History of asthma 09/21/2015  . Groin pain 11/25/2015  . Bilateral carpal tunnel syndrome 11/25/2015  . Hematuria 11/25/2015  . Hypertriglyceridemia 11/26/2015  . Low HDL (under 40) 11/26/2015  . Anterolisthesis 11/26/2015  . Male hypogonadism 03/08/2016  . B12 deficiency 09/20/2016  . Anxiety state 09/20/2016   Resolved Ambulatory Problems    Diagnosis Date Noted  . No Resolved Ambulatory Problems   No Additional Past Medical History      Review of Systems  All other systems reviewed and are negative.      Objective:   Physical Exam  Constitutional: He is oriented to person, place, and time. He appears well-developed and well-nourished.  HENT:  Head: Normocephalic and atraumatic.  Cardiovascular: Normal rate, regular rhythm and normal heart sounds.  Pulmonary/Chest: Effort normal and breath sounds normal.  Neurological: He is alert and oriented to person, place, and time.  Psychiatric: He has a normal mood and affect. His behavior is  normal.          Assessment & Plan:  Marland Kitchen.Marland Kitchen.Luke Villarreal was seen today for f/u xanax, wants to have b12 check needs refill and warts on hands.  Diagnoses and all orders for this visit:  Hemorrhoids, unspecified hemorrhoid type -     hydrocortisone (ANUSOL-HC) 2.5 % rectal cream; Place 1 application rectally 2 (two) times daily.  Anxiety state -     ALPRAZolam (XANAX) 1 MG tablet; Take 1 tablet (1 mg total) by mouth at bedtime.  B12 deficiency -     cyanocobalamin (,VITAMIN B-12,) 1000 MCG/ML injection; Inject 1 mL (1,000 mcg total) into the muscle every 30 (thirty) days. -     NEEDLE, DISP, 25 G 25G X 1" MISC; Use to inject medication   .Marland Kitchen. [facility] screen [facility] 04/12/2017  Decreased Interest 0  Down, Depressed, Hopeless 0  PHQ - 2 Score 0  Altered sleeping 0  Change in appetite 2  Feeling bad or failure about yourself  0  Trouble concentrating 0  Moving slowly or fidgety/restless 0  Suicidal thoughts 0  PHQ-9 Score 2   .. GAD 7 : Generalized Anxiety Score 04/12/2017  Nervous, Anxious, on Edge 2  Control/stop worrying 1  Worry too much - different things 1  Trouble relaxing 1  Restless 0  Easily annoyed or irritable 0  Afraid - awful might happen 1  Total GAD 7 Score 6    Refilled xanax. Follow up in 6  months. Discussed trying other daily medications pt declines due to hx of not tolerating them.    b12 refilled. Will hold on labs until first of year when he gets insurance to make sure keeping b12 levels in normal range. We can also recheck testosterone at that time.   BP is elevated today. Discussed to start checking it over next 6 months. Forgot to recheck before he left. If continues to be elevated at next visit will need to discuss medications. Discussed low salt diet and more exercise.

## 2017-05-31 ENCOUNTER — Other Ambulatory Visit: Payer: Self-pay | Admitting: *Deleted

## 2017-05-31 DIAGNOSIS — K2100 Gastro-esophageal reflux disease with esophagitis, without bleeding: Secondary | ICD-10-CM

## 2017-05-31 MED ORDER — OMEPRAZOLE 40 MG PO CPDR: 40.0000 mg | DELAYED_RELEASE_CAPSULE | Freq: Every day | ORAL | 4 refills | Status: DC

## 2017-07-13 IMAGING — DX DG LUMBAR SPINE COMPLETE 4+V
5 series · 5 of 5 positions shown · non-contrast
Comparison: None.

CLINICAL DATA: Low back and right lower extremity pain for 1 month
without known injury.

EXAM:
LUMBAR SPINE - COMPLETE 4+ VIEW

[l-spine ap]
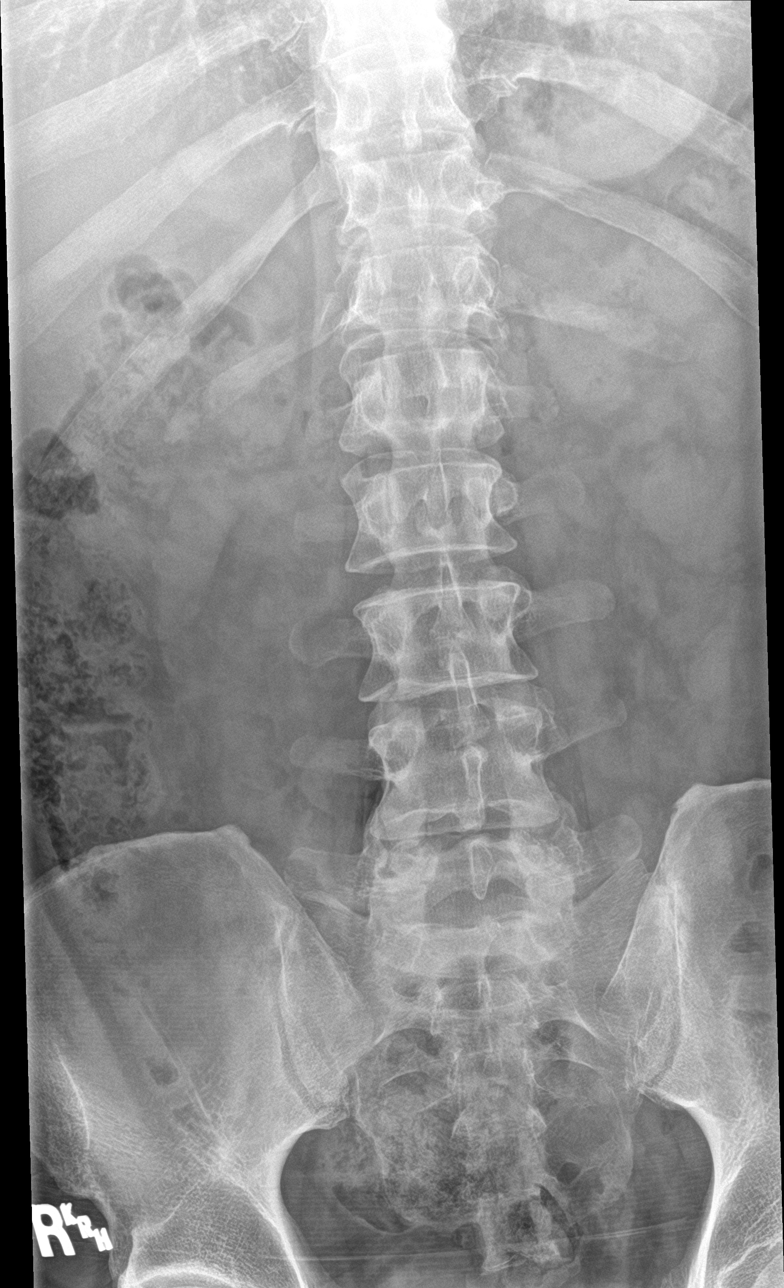

[l-spine obl (1 of 2)]
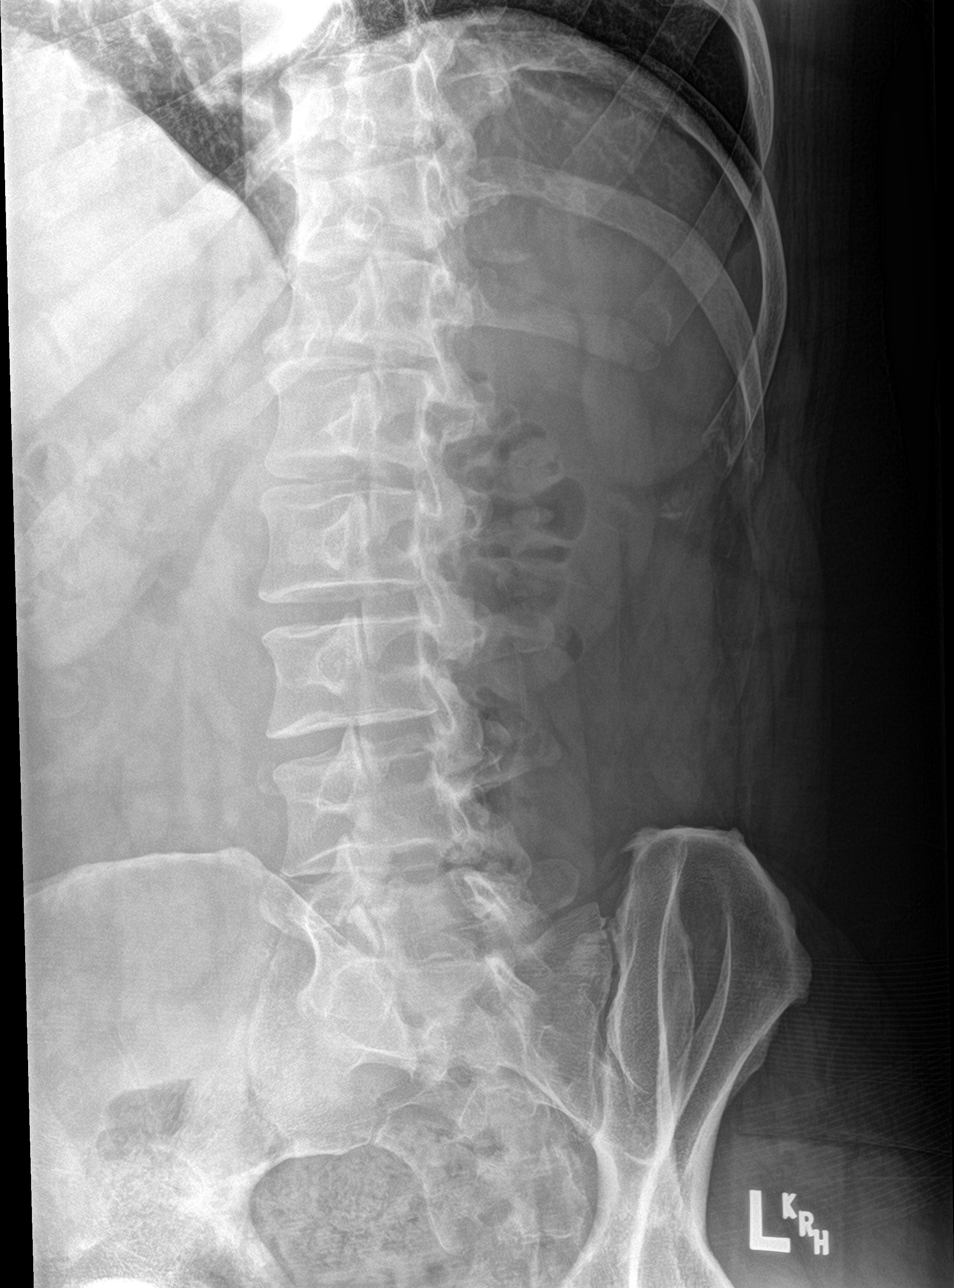

[l-spine obl (2 of 2)]
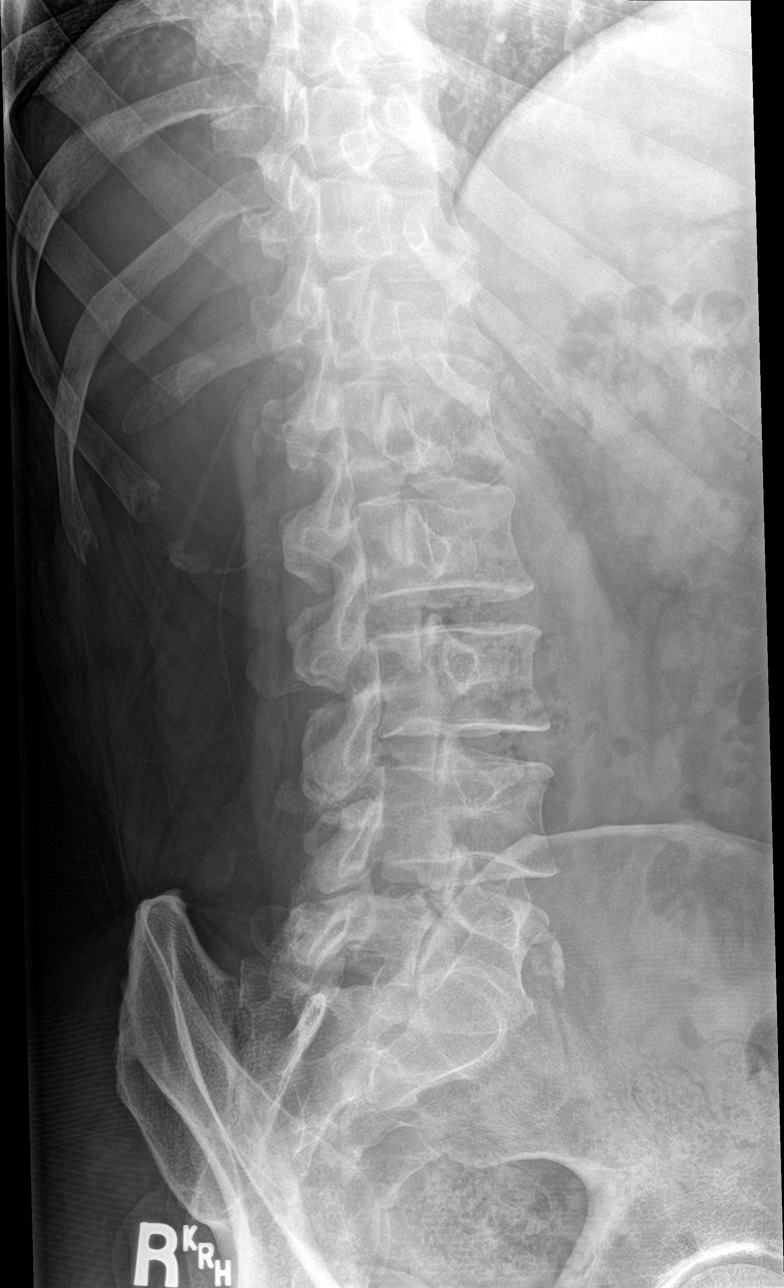

[l-spine lat]
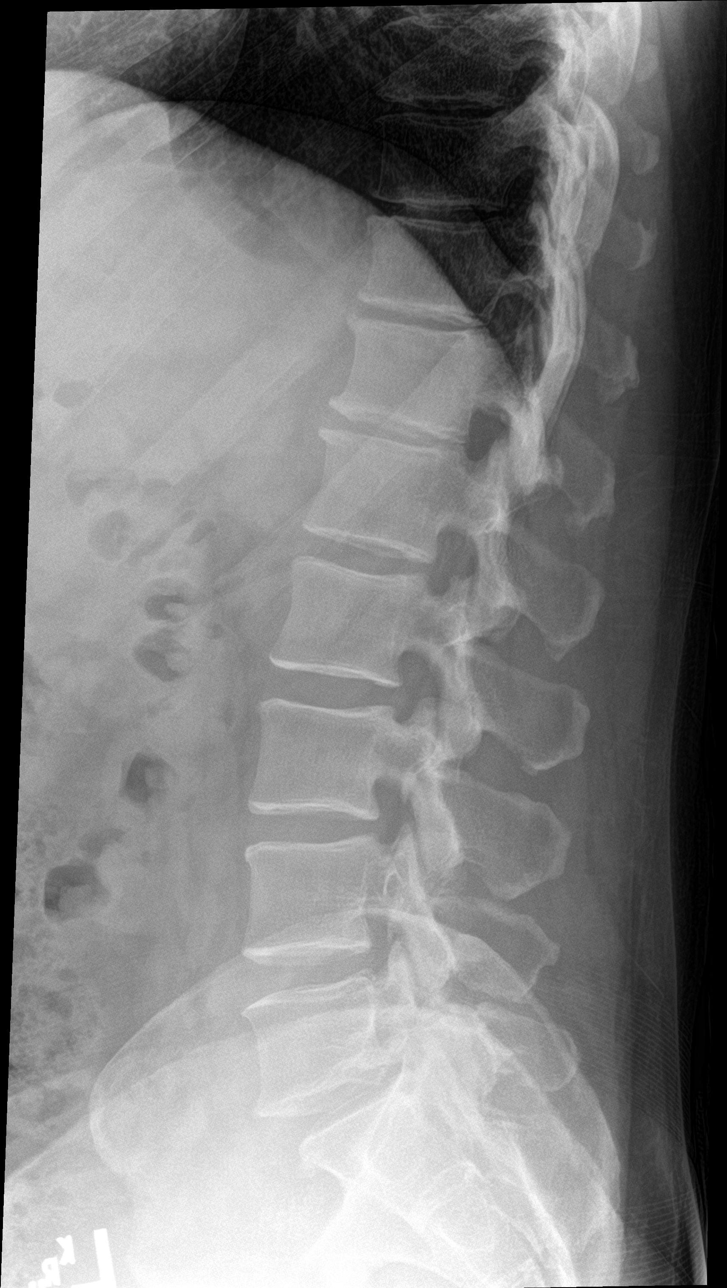

[l-spine spot]
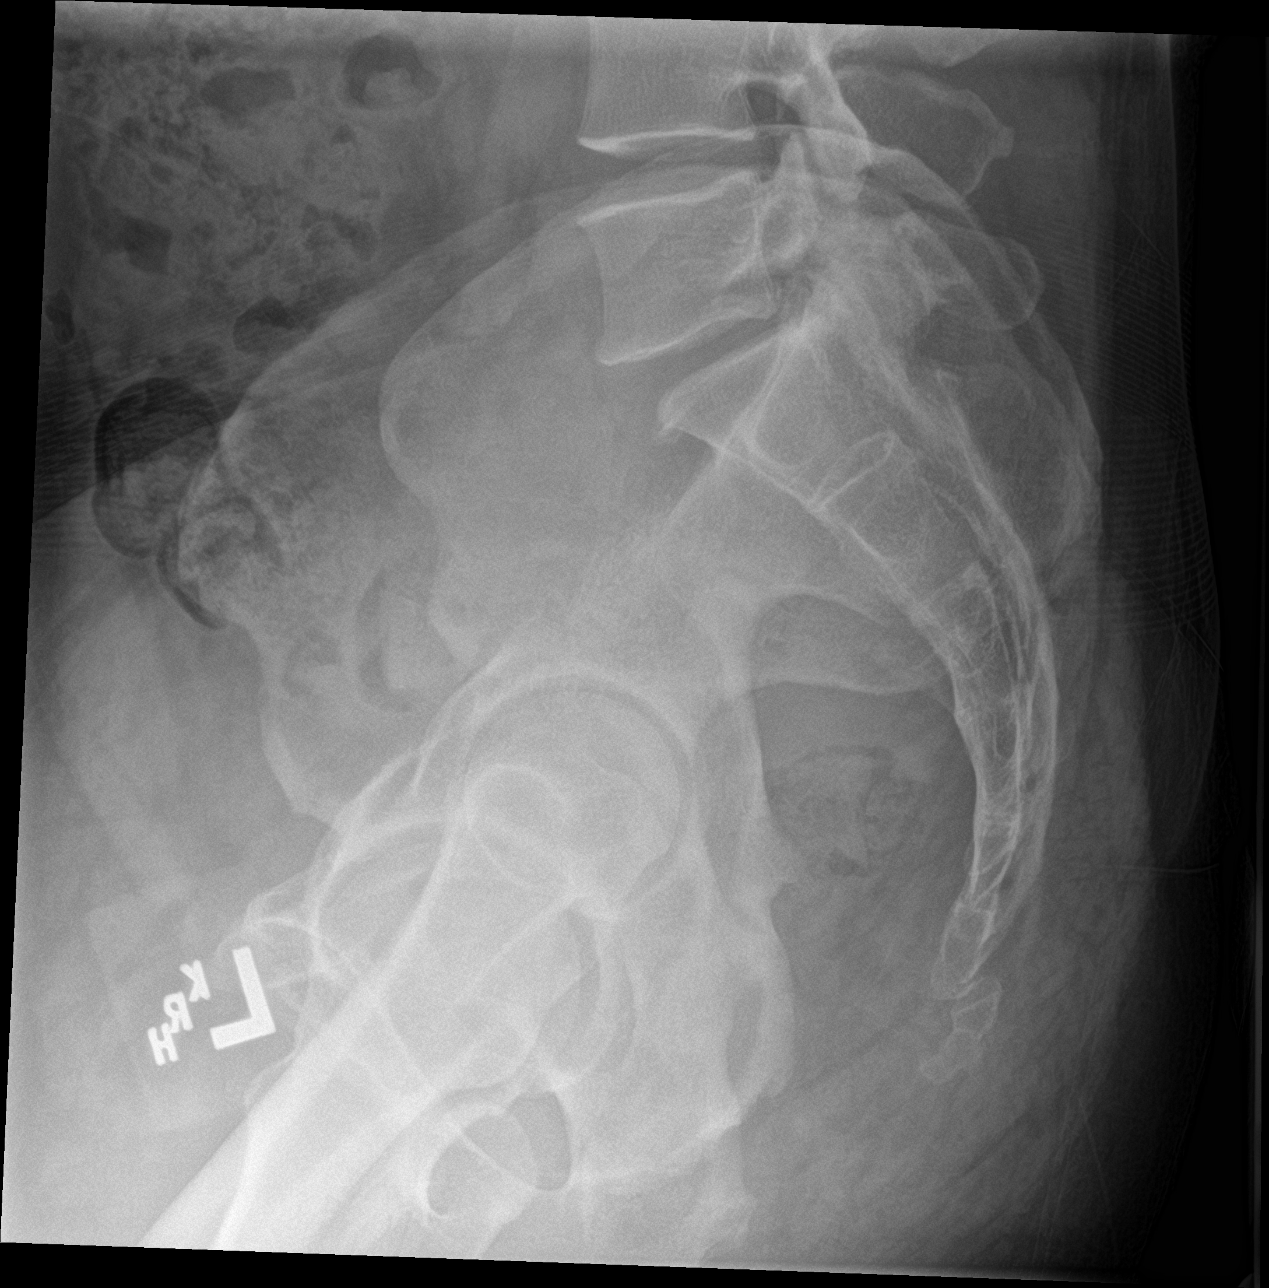

[5 of 5 positions shown; findings below may reference images not displayed]

FINDINGS: Minimal grade 1 anterolisthesis of L5-S1 is noted secondary to
bilateral pars defects of L5. No acute fracture is noted. Disc
spaces are well-maintained.
IMPRESSION: Minimal grade 1 anterolisthesis of L5-S1 secondary to bilateral L5
pars defects. No other significant abnormality seen in the lumbar
spine.

## 2017-09-11 ENCOUNTER — Ambulatory Visit: Payer: Self-pay | Admitting: Physician Assistant

## 2017-09-29 ENCOUNTER — Other Ambulatory Visit: Payer: Self-pay | Admitting: Physician Assistant

## 2017-09-29 DIAGNOSIS — F411 Generalized anxiety disorder: Secondary | ICD-10-CM

## 2017-09-29 MED ORDER — ALPRAZOLAM 1 MG PO TABS: 1.0000 mg | ORAL_TABLET | Freq: Every day | ORAL | 0 refills | Status: DC

## 2017-11-03 ENCOUNTER — Other Ambulatory Visit: Payer: Self-pay | Admitting: Physician Assistant

## 2017-11-03 DIAGNOSIS — F411 Generalized anxiety disorder: Secondary | ICD-10-CM

## 2017-11-03 MED ORDER — ALPRAZOLAM 1 MG PO TABS: 1.0000 mg | ORAL_TABLET | Freq: Every day | ORAL | 0 refills | Status: DC

## 2017-11-03 NOTE — Progress Notes (Signed)
Refilled for one month. Needs appt.

## 2017-12-29 ENCOUNTER — Other Ambulatory Visit: Payer: Self-pay | Admitting: Physician Assistant

## 2017-12-29 DIAGNOSIS — F411 Generalized anxiety disorder: Secondary | ICD-10-CM

## 2017-12-29 MED ORDER — ALPRAZOLAM 1 MG PO TABS: 1.0000 mg | ORAL_TABLET | Freq: Every day | ORAL | 0 refills | Status: DC

## 2018-01-29 ENCOUNTER — Telehealth: Payer: Self-pay

## 2018-01-29 DIAGNOSIS — F411 Generalized anxiety disorder: Secondary | ICD-10-CM
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
## 2018-01-29 NOTE — Telephone Encounter (Signed)
Can we find out the dosing of pain medication he is on. We have to be careful mixing xanax and opiates with an increased risk of sudden death. I may can give him a few tablets but he cannot take them together and only as needed. He needs to come in within the month as well.

## 2018-01-30 MED ORDER — ALPRAZOLAM 1 MG PO TABS: 1.0000 mg | ORAL_TABLET | Freq: Every day | ORAL | 0 refills | Status: DC

## 2018-01-30 NOTE — Telephone Encounter (Signed)
Left message for a return call

## 2018-01-30 NOTE — Telephone Encounter (Signed)
Ok I will give 20 tablets to use sparingly and NOT in combination with hydrocodone or tramadol. Must have follow up in 1 month.

## 2018-01-30 NOTE — Telephone Encounter (Signed)
Ms. Klinke is returning Angela's call. She states her husband is taking Hydrocodone 5/325 and Tramadol HCL 50 mg.  Thanks!

## 2018-01-30 NOTE — Telephone Encounter (Signed)
Wife advised. 

## 2018-02-22 ENCOUNTER — Telehealth: Payer: Self-pay | Admitting: Physician Assistant

## 2018-02-22 NOTE — Telephone Encounter (Signed)
PT called in needing a refill on his prescribed Xanax by tomorrow. The schedule is completely full on 02/23/18.  Please advise.

## 2018-02-23 ENCOUNTER — Other Ambulatory Visit: Payer: Self-pay | Admitting: Physician Assistant

## 2018-02-23 DIAGNOSIS — F411 Generalized anxiety disorder: Secondary | ICD-10-CM

## 2018-02-23 MED ORDER — ALPRAZOLAM 1 MG PO TABS: 1.0000 mg | ORAL_TABLET | Freq: Every day | ORAL | 0 refills | Status: DC
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
## 2018-02-23 NOTE — Telephone Encounter (Signed)
Refill request is early

## 2018-03-14 ENCOUNTER — Ambulatory Visit (INDEPENDENT_AMBULATORY_CARE_PROVIDER_SITE_OTHER): Payer: Medicaid Other | Admitting: Physician Assistant

## 2018-03-14 ENCOUNTER — Encounter: Payer: Self-pay | Admitting: Physician Assistant

## 2018-03-14 VITALS — BP 150/105 | HR 93 | Ht 67.0 in

## 2018-03-14 DIAGNOSIS — N501 Vascular disorders of male genital organs: Secondary | ICD-10-CM

## 2018-03-14 DIAGNOSIS — S32810S Multiple fractures of pelvis with stable disruption of pelvic ring, sequela: Secondary | ICD-10-CM

## 2018-03-14 DIAGNOSIS — S3210XS Unspecified fracture of sacrum, sequela: Secondary | ICD-10-CM

## 2018-03-14 DIAGNOSIS — R102 Pelvic and perineal pain: Secondary | ICD-10-CM

## 2018-03-14 DIAGNOSIS — F411 Generalized anxiety disorder: Secondary | ICD-10-CM

## 2018-03-14 MED ORDER — ALPRAZOLAM 1 MG PO TABS: 1.0000 mg | ORAL_TABLET | Freq: Every day | ORAL | 5 refills | Status: DC

## 2018-03-14 MED ORDER — HYDROCODONE-ACETAMINOPHEN 5-325 MG PO TABS: 1.0000 | ORAL_TABLET | Freq: Three times a day (TID) | ORAL | 0 refills | Status: DC | PRN

## 2018-03-14 MED ORDER — MELOXICAM 15 MG PO TABS: 15.0000 mg | ORAL_TABLET | Freq: Every day | ORAL | 5 refills | Status: DC

## 2018-03-14 MED ORDER — GABAPENTIN 300 MG PO CAPS: 300.0000 mg | ORAL_CAPSULE | Freq: Three times a day (TID) | ORAL | 2 refills | Status: DC

## 2018-03-14 MED ORDER — CYCLOBENZAPRINE HCL 10 MG PO TABS: ORAL_TABLET | ORAL | 2 refills | Status: DC
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
## 2018-03-20 ENCOUNTER — Encounter: Payer: Self-pay | Admitting: Physician Assistant

## 2018-03-20 DIAGNOSIS — N501 Vascular disorders of male genital organs: Secondary | ICD-10-CM | POA: Insufficient documentation

## 2018-03-20 DIAGNOSIS — S3210XA Unspecified fracture of sacrum, initial encounter for closed fracture: Secondary | ICD-10-CM | POA: Insufficient documentation

## 2018-03-20 DIAGNOSIS — S32810A Multiple fractures of pelvis with stable disruption of pelvic ring, initial encounter for closed fracture: Secondary | ICD-10-CM | POA: Insufficient documentation

## 2018-04-11 ENCOUNTER — Telehealth: Payer: Self-pay

## 2018-04-11 NOTE — Telephone Encounter (Signed)
Viviann SpareSteven called today in need of a refill on his Hydrocodone-acetaminophen (vicodin). I saw that his last refill was 03/14/2018. I just wanted to let you know! Thanks so much!

## 2018-04-12 ENCOUNTER — Other Ambulatory Visit: Payer: Self-pay

## 2018-04-12 DIAGNOSIS — S3210XS Unspecified fracture of sacrum, sequela: Secondary | ICD-10-CM

## 2018-04-12 DIAGNOSIS — R102 Pelvic and perineal pain: Secondary | ICD-10-CM

## 2018-04-12 DIAGNOSIS — N501 Vascular disorders of male genital organs: Secondary | ICD-10-CM

## 2018-04-12 DIAGNOSIS — S32810S Multiple fractures of pelvis with stable disruption of pelvic ring, sequela: Secondary | ICD-10-CM

## 2018-04-12 MED ORDER — HYDROCODONE-ACETAMINOPHEN 5-325 MG PO TABS: 1.0000 | ORAL_TABLET | Freq: Three times a day (TID) | ORAL | 0 refills | Status: DC | PRN

## 2018-04-12 NOTE — Telephone Encounter (Signed)
I sent in 2 weeks supply, Jade's last note discussed 6-8 weeks total of therapy, will leave further refills to her discretion, patient may require appointment

## 2018-04-12 NOTE — Telephone Encounter (Signed)
Luke Villarreal is one of Luke Villarreal's patients he called and is in need of a refill on his Hydrocodone-acetaminophen. His last refill was on 03/14/2018. I have pended the medication for refill. Thanks so much!

## 2018-05-07 ENCOUNTER — Other Ambulatory Visit: Payer: Self-pay

## 2018-05-07 DIAGNOSIS — S32810S Multiple fractures of pelvis with stable disruption of pelvic ring, sequela: Secondary | ICD-10-CM

## 2018-05-07 DIAGNOSIS — N501 Vascular disorders of male genital organs: Secondary | ICD-10-CM

## 2018-05-07 DIAGNOSIS — S3210XS Unspecified fracture of sacrum, sequela: Secondary | ICD-10-CM

## 2018-05-07 DIAGNOSIS — R102 Pelvic and perineal pain: Secondary | ICD-10-CM

## 2018-05-07 MED ORDER — HYDROCODONE-ACETAMINOPHEN 5-325 MG PO TABS: 1.0000 | ORAL_TABLET | Freq: Three times a day (TID) | ORAL | 0 refills | Status: DC | PRN

## 2018-05-07 NOTE — Telephone Encounter (Signed)
Wife called requesting RF on Norco   Last RX sent 04/12/2018 for #60, 1-2 tabs po TID PRN  RX pended, please send if appropriate

## 2018-05-08 NOTE — Telephone Encounter (Signed)
Left msg- RX sent

## 2018-06-05 ENCOUNTER — Other Ambulatory Visit: Payer: Self-pay | Admitting: Physician Assistant

## 2018-06-05 ENCOUNTER — Telehealth: Payer: Self-pay | Admitting: Physician Assistant

## 2018-06-05 DIAGNOSIS — N501 Vascular disorders of male genital organs: Secondary | ICD-10-CM

## 2018-06-05 DIAGNOSIS — R102 Pelvic and perineal pain: Secondary | ICD-10-CM

## 2018-06-05 DIAGNOSIS — S3210XS Unspecified fracture of sacrum, sequela: Secondary | ICD-10-CM

## 2018-06-05 DIAGNOSIS — S32810S Multiple fractures of pelvis with stable disruption of pelvic ring, sequela: Secondary | ICD-10-CM

## 2018-06-05 MED ORDER — HYDROCODONE-ACETAMINOPHEN 5-325 MG PO TABS: 1.0000 | ORAL_TABLET | Freq: Three times a day (TID) | ORAL | 0 refills | Status: DC | PRN

## 2018-06-05 NOTE — Progress Notes (Signed)
Pt's wife calls in asking for a refill on pain medication.  Last refill was 05/07/18 of 90 tablets.  Last OV was 03/14/18.   Will need OV in next month.  Will refill with post date of 06/07/18.   PDMP reviewed during this encounter.

## 2018-06-05 NOTE — Telephone Encounter (Signed)
Please call and see if we can get most recent ER visit and images from ER [place] for low back pain.

## 2018-06-06 NOTE — Telephone Encounter (Signed)
You can see the note and xray report in Care Everywhere.

## 2018-06-06 NOTE — Telephone Encounter (Signed)
Hmm I couldn't find it.

## 2018-06-10 ENCOUNTER — Other Ambulatory Visit: Payer: Self-pay | Admitting: Physician Assistant

## 2018-06-10 DIAGNOSIS — S3210XS Unspecified fracture of sacrum, sequela: Secondary | ICD-10-CM

## 2018-06-10 DIAGNOSIS — N501 Vascular disorders of male genital organs: Secondary | ICD-10-CM

## 2018-06-10 DIAGNOSIS — R102 Pelvic and perineal pain: Secondary | ICD-10-CM

## 2018-06-10 DIAGNOSIS — S32810S Multiple fractures of pelvis with stable disruption of pelvic ring, sequela: Secondary | ICD-10-CM

## 2018-06-13 ENCOUNTER — Ambulatory Visit: Payer: Medicaid Other | Admitting: Physician Assistant

## 2018-06-15 ENCOUNTER — Ambulatory Visit (INDEPENDENT_AMBULATORY_CARE_PROVIDER_SITE_OTHER): Payer: BLUE CROSS/BLUE SHIELD | Admitting: Physician Assistant

## 2018-06-15 ENCOUNTER — Encounter: Payer: Self-pay | Admitting: Physician Assistant

## 2018-06-15 VITALS — BP 155/98 | HR 72 | Ht 67.0 in | Wt 205.0 lb

## 2018-06-15 DIAGNOSIS — K649 Unspecified hemorrhoids: Secondary | ICD-10-CM | POA: Diagnosis not present

## 2018-06-15 DIAGNOSIS — F331 Major depressive disorder, recurrent, moderate: Secondary | ICD-10-CM

## 2018-06-15 DIAGNOSIS — S3210XS Unspecified fracture of sacrum, sequela: Secondary | ICD-10-CM

## 2018-06-15 DIAGNOSIS — J301 Allergic rhinitis due to pollen: Secondary | ICD-10-CM | POA: Diagnosis not present

## 2018-06-15 DIAGNOSIS — M51369 Other intervertebral disc degeneration, lumbar region without mention of lumbar back pain or lower extremity pain: Secondary | ICD-10-CM

## 2018-06-15 DIAGNOSIS — Z131 Encounter for screening for diabetes mellitus: Secondary | ICD-10-CM

## 2018-06-15 DIAGNOSIS — H1013 Acute atopic conjunctivitis, bilateral: Secondary | ICD-10-CM | POA: Diagnosis not present

## 2018-06-15 DIAGNOSIS — E538 Deficiency of other specified B group vitamins: Secondary | ICD-10-CM

## 2018-06-15 DIAGNOSIS — M5136 Other intervertebral disc degeneration, lumbar region: Secondary | ICD-10-CM

## 2018-06-15 DIAGNOSIS — Z13220 Encounter for screening for lipoid disorders: Secondary | ICD-10-CM

## 2018-06-15 DIAGNOSIS — G894 Chronic pain syndrome: Secondary | ICD-10-CM

## 2018-06-15 MED ORDER — HYDROCORTISONE 2.5 % RE CREA: 1.0000 "application " | TOPICAL_CREAM | Freq: Two times a day (BID) | RECTAL | 5 refills | Status: DC

## 2018-06-15 MED ORDER — CELECOXIB 200 MG PO CAPS: ORAL_CAPSULE | ORAL | 2 refills | Status: DC

## 2018-06-15 MED ORDER — OLOPATADINE HCL 0.1 % OP SOLN: 1.0000 [drp] | Freq: Two times a day (BID) | OPHTHALMIC | 11 refills | Status: DC

## 2018-06-15 MED ORDER — DULOXETINE HCL 30 MG PO CPEP: 30.0000 mg | ORAL_CAPSULE | Freq: Every day | ORAL | 2 refills | Status: DC

## 2018-06-15 MED ORDER — PREDNISONE 50 MG PO TABS: ORAL_TABLET | ORAL | 0 refills | Status: DC

## 2018-06-15 NOTE — Patient Instructions (Signed)
Start cymbalta and celebrex for pain.  Continue norco for now.  Follow up with ortho.   Follow up in 3 months.
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
## 2018-06-20 ENCOUNTER — Encounter: Payer: Self-pay | Admitting: Physician Assistant

## 2018-06-20 DIAGNOSIS — M51369 Other intervertebral disc degeneration, lumbar region without mention of lumbar back pain or lower extremity pain: Secondary | ICD-10-CM | POA: Insufficient documentation

## 2018-06-20 DIAGNOSIS — J301 Allergic rhinitis due to pollen: Secondary | ICD-10-CM | POA: Insufficient documentation

## 2018-06-20 DIAGNOSIS — H1013 Acute atopic conjunctivitis, bilateral: Secondary | ICD-10-CM | POA: Insufficient documentation

## 2018-06-20 DIAGNOSIS — M5136 Other intervertebral disc degeneration, lumbar region: Secondary | ICD-10-CM | POA: Insufficient documentation

## 2018-07-05 ENCOUNTER — Other Ambulatory Visit: Payer: Self-pay | Admitting: Physician Assistant

## 2018-07-05 DIAGNOSIS — R102 Pelvic and perineal pain: Secondary | ICD-10-CM

## 2018-07-05 DIAGNOSIS — N501 Vascular disorders of male genital organs: Secondary | ICD-10-CM

## 2018-07-05 DIAGNOSIS — S3210XS Unspecified fracture of sacrum, sequela: Secondary | ICD-10-CM

## 2018-07-05 DIAGNOSIS — S32810S Multiple fractures of pelvis with stable disruption of pelvic ring, sequela: Secondary | ICD-10-CM

## 2018-07-05 NOTE — Telephone Encounter (Signed)
Jade's patient needs a refill on Hydrocodone.

## 2018-07-06 MED ORDER — HYDROCODONE-ACETAMINOPHEN 5-325 MG PO TABS: 1.0000 | ORAL_TABLET | Freq: Three times a day (TID) | ORAL | 0 refills | Status: DC | PRN

## 2018-07-06 NOTE — Telephone Encounter (Signed)
Left VM for Pt to return clinic call.  Attempted to contact Luke Villarreal, no answer and no VM.

## 2018-07-06 NOTE — Telephone Encounter (Signed)
Did go ahead and refill hydrocodone.  Per note the goal is to wean him down to about 60 tabs over the next 3 months or.  So instead of refilling 90 tabs I refilled 80 tabs with the plan to decrease down to 70 the next month and then down to 60 the following month.  He needs to keep his regularly scheduled follow-up.

## 2018-07-06 NOTE — Telephone Encounter (Signed)
Pt's wife advised.

## 2018-08-15 ENCOUNTER — Encounter: Payer: Self-pay | Admitting: Physician Assistant

## 2018-08-15 ENCOUNTER — Ambulatory Visit (INDEPENDENT_AMBULATORY_CARE_PROVIDER_SITE_OTHER): Payer: BLUE CROSS/BLUE SHIELD | Admitting: Physician Assistant

## 2018-08-15 DIAGNOSIS — R102 Pelvic and perineal pain: Secondary | ICD-10-CM

## 2018-08-15 DIAGNOSIS — S3210XS Unspecified fracture of sacrum, sequela: Secondary | ICD-10-CM

## 2018-08-15 DIAGNOSIS — S32810S Multiple fractures of pelvis with stable disruption of pelvic ring, sequela: Secondary | ICD-10-CM

## 2018-08-15 DIAGNOSIS — G894 Chronic pain syndrome: Secondary | ICD-10-CM

## 2018-08-15 DIAGNOSIS — M5136 Other intervertebral disc degeneration, lumbar region: Secondary | ICD-10-CM

## 2018-08-15 DIAGNOSIS — K2100 Gastro-esophageal reflux disease with esophagitis, without bleeding: Secondary | ICD-10-CM

## 2018-08-15 DIAGNOSIS — F411 Generalized anxiety disorder: Secondary | ICD-10-CM

## 2018-08-15 DIAGNOSIS — N501 Vascular disorders of male genital organs: Secondary | ICD-10-CM

## 2018-08-15 MED ORDER — CELECOXIB 200 MG PO CAPS: ORAL_CAPSULE | ORAL | 1 refills | Status: DC

## 2018-08-15 MED ORDER — CYCLOBENZAPRINE HCL 10 MG PO TABS: ORAL_TABLET | ORAL | 5 refills | Status: DC

## 2018-08-15 MED ORDER — ALPRAZOLAM 1 MG PO TABS: 1.0000 mg | ORAL_TABLET | Freq: Every day | ORAL | 5 refills | Status: DC

## 2018-08-15 MED ORDER — LEVOCETIRIZINE DIHYDROCHLORIDE 5 MG PO TABS: 5.0000 mg | ORAL_TABLET | Freq: Every evening | ORAL | 4 refills | Status: DC

## 2018-08-15 MED ORDER — HYDROCODONE-ACETAMINOPHEN 5-325 MG PO TABS: 1.0000 | ORAL_TABLET | Freq: Three times a day (TID) | ORAL | 0 refills | Status: DC | PRN

## 2018-08-15 MED ORDER — OMEPRAZOLE 40 MG PO CPDR: 40.0000 mg | DELAYED_RELEASE_CAPSULE | Freq: Every day | ORAL | 4 refills | Status: DC

## 2018-08-15 NOTE — Progress Notes (Signed)
Only complaint is increased GERD.

## 2018-08-18 ENCOUNTER — Encounter: Payer: Self-pay | Admitting: Physician Assistant

## 2018-08-18 DIAGNOSIS — G894 Chronic pain syndrome: Secondary | ICD-10-CM | POA: Insufficient documentation

## 2018-08-18 DIAGNOSIS — K2100 Gastro-esophageal reflux disease with esophagitis, without bleeding: Secondary | ICD-10-CM | POA: Insufficient documentation
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
## 2018-08-22 ENCOUNTER — Other Ambulatory Visit: Payer: Self-pay | Admitting: Physician Assistant

## 2018-08-22 DIAGNOSIS — E538 Deficiency of other specified B group vitamins: Secondary | ICD-10-CM

## 2018-08-22 NOTE — Telephone Encounter (Signed)
Patient needs to have B12 level drawn.  Patient's wife called the office. Left message on machine for patient's wife to call back. [phone].

## 2018-09-12 ENCOUNTER — Telehealth: Payer: Self-pay | Admitting: Physician Assistant

## 2018-09-12 DIAGNOSIS — N501 Vascular disorders of male genital organs: Secondary | ICD-10-CM

## 2018-09-12 DIAGNOSIS — S32810S Multiple fractures of pelvis with stable disruption of pelvic ring, sequela: Secondary | ICD-10-CM

## 2018-09-12 DIAGNOSIS — R102 Pelvic and perineal pain: Secondary | ICD-10-CM

## 2018-09-12 DIAGNOSIS — S3210XS Unspecified fracture of sacrum, sequela: Secondary | ICD-10-CM

## 2018-09-12 MED ORDER — HYDROCODONE-ACETAMINOPHEN 5-325 MG PO TABS: 1.0000 | ORAL_TABLET | Freq: Three times a day (TID) | ORAL | 0 refills | Status: DC | PRN

## 2018-09-12 NOTE — Telephone Encounter (Signed)
..  PDMP reviewed during this encounter. No concerns.  Decreased dose to 45 tablets.

## 2018-10-15 ENCOUNTER — Telehealth: Payer: Self-pay | Admitting: Physician Assistant

## 2018-10-15 DIAGNOSIS — N501 Vascular disorders of male genital organs: Secondary | ICD-10-CM

## 2018-10-15 DIAGNOSIS — R102 Pelvic and perineal pain: Secondary | ICD-10-CM

## 2018-10-15 DIAGNOSIS — S32810S Multiple fractures of pelvis with stable disruption of pelvic ring, sequela: Secondary | ICD-10-CM

## 2018-10-15 DIAGNOSIS — S3210XS Unspecified fracture of sacrum, sequela: Secondary | ICD-10-CM

## 2018-10-15 MED ORDER — HYDROCODONE-ACETAMINOPHEN 5-325 MG PO TABS: 1.0000 | ORAL_TABLET | Freq: Three times a day (TID) | ORAL | 0 refills | Status: DC | PRN

## 2018-10-15 NOTE — Telephone Encounter (Signed)
..  PDMP reviewed during this encounter. No concerns.  Decreased to 30 count.

## 2018-11-12 ENCOUNTER — Telehealth: Payer: Self-pay | Admitting: Physician Assistant

## 2018-11-12 DIAGNOSIS — N501 Vascular disorders of male genital organs: Secondary | ICD-10-CM

## 2018-11-12 DIAGNOSIS — S3210XS Unspecified fracture of sacrum, sequela: Secondary | ICD-10-CM

## 2018-11-12 DIAGNOSIS — R102 Pelvic and perineal pain: Secondary | ICD-10-CM

## 2018-11-12 DIAGNOSIS — S32810S Multiple fractures of pelvis with stable disruption of pelvic ring, sequela: Secondary | ICD-10-CM

## 2018-11-12 MED ORDER — HYDROCODONE-ACETAMINOPHEN 5-325 MG PO TABS: 1.0000 | ORAL_TABLET | Freq: Three times a day (TID) | ORAL | 0 refills | Status: DC | PRN

## 2018-11-12 NOTE — Telephone Encounter (Signed)
Calls in for refill of norco due to chronic pain.  Marland Kitchen.PDMP reviewed during this encounter. Refilled #30

## 2018-12-06 ENCOUNTER — Other Ambulatory Visit: Payer: Self-pay | Admitting: Physician Assistant

## 2018-12-06 ENCOUNTER — Telehealth: Payer: Self-pay | Admitting: Physician Assistant

## 2018-12-06 DIAGNOSIS — G894 Chronic pain syndrome: Secondary | ICD-10-CM

## 2018-12-06 DIAGNOSIS — M5136 Other intervertebral disc degeneration, lumbar region: Secondary | ICD-10-CM

## 2018-12-06 NOTE — Telephone Encounter (Signed)
Mrs. Joynt called. Husband needs refill on his Prednisone.

## 2018-12-07 ENCOUNTER — Other Ambulatory Visit: Payer: Self-pay | Admitting: Physician Assistant

## 2018-12-07 MED ORDER — PREDNISONE 50 MG PO TABS: ORAL_TABLET | ORAL | 0 refills | Status: DC

## 2018-12-07 NOTE — Telephone Encounter (Signed)
Left message on machine for patient/wife to call back.   

## 2018-12-07 NOTE — Telephone Encounter (Signed)
I do not see prednisone on his med list. Can we confirm this is what she is talking about?

## 2018-12-12 ENCOUNTER — Telehealth: Payer: Self-pay

## 2018-12-12 ENCOUNTER — Other Ambulatory Visit: Payer: Self-pay | Admitting: Physician Assistant

## 2018-12-12 DIAGNOSIS — R102 Pelvic and perineal pain: Secondary | ICD-10-CM

## 2018-12-12 DIAGNOSIS — S3210XS Unspecified fracture of sacrum, sequela: Secondary | ICD-10-CM

## 2018-12-12 DIAGNOSIS — S32810S Multiple fractures of pelvis with stable disruption of pelvic ring, sequela: Secondary | ICD-10-CM

## 2018-12-12 DIAGNOSIS — N501 Vascular disorders of male genital organs: Secondary | ICD-10-CM

## 2018-12-12 MED ORDER — HYDROCODONE-ACETAMINOPHEN 5-325 MG PO TABS: 1.0000 | ORAL_TABLET | Freq: Three times a day (TID) | ORAL | 0 refills | Status: DC | PRN

## 2018-12-12 NOTE — Telephone Encounter (Signed)
Sent in limited supply.  Please call him and let him know he needs an appointment.  Please see wife's chart for updated information there.

## 2018-12-12 NOTE — Telephone Encounter (Signed)
RX pended, however please note that last OV was 08/15/18, no upcoming appt scheduled.   Last RX was written 11/12/18.

## 2018-12-12 NOTE — Telephone Encounter (Signed)
Wife sent MyChart msg:  December 12, 2018 [place], [place] to [place], [place]      10:48 AM His DOB is 1976/03/10 Hiram Gash Emeterio Reeve, DO     7:49 AM Note   5 mins spent      Emeterio Reeve, DO to [place], [place]      7:49 AM Hi Ms Godinho,  This is Dr Sheppard Coil I am covering for [facility] while she is out of the office. I refilled your pain medications.  I think I found your husband's information in the chart system, but since I do not know the both of you personally can you please confirm his date of birth before I can send any medication? (In the future, please send requests through individual MyChart accounts, this makes this faster for [place] to handle)  Thanks,  Dr. Loni Muse.   Last read by Tilda Burrow Greening at 10:47 AM on 12/12/2018. Narda Rutherford, CMA to Emeterio Reeve, DO      7:13 AM Her husband is due for a follow up visit.  December 11, 2018 [place], [place] to [place], [place], [place]      11:27 PM Luvenia Starch, Could you please call in mine and [place] pain medication? My pain level on my ankle is pretty much the same. Gerilyn Nestle hasn't changed.... Just let me know.Marland KitchenMarland KitchenMarland KitchenThanks and have a great day...if we have to do a phone visit, then that's fine until we can get up there.Marland KitchenMarland Kitchen

## 2018-12-13 NOTE — Telephone Encounter (Signed)
Pt wife advised via MyChart msg

## 2018-12-13 NOTE — Telephone Encounter (Signed)
Refilled hydrocodone 

## 2019-01-02 ENCOUNTER — Ambulatory Visit (INDEPENDENT_AMBULATORY_CARE_PROVIDER_SITE_OTHER): Payer: BLUE CROSS/BLUE SHIELD | Admitting: Physician Assistant

## 2019-01-02 ENCOUNTER — Encounter: Payer: Self-pay | Admitting: Physician Assistant

## 2019-01-02 VITALS — Ht 67.0 in | Wt 205.0 lb

## 2019-01-02 DIAGNOSIS — S32810S Multiple fractures of pelvis with stable disruption of pelvic ring, sequela: Secondary | ICD-10-CM

## 2019-01-02 DIAGNOSIS — R102 Pelvic and perineal pain: Secondary | ICD-10-CM | POA: Diagnosis not present

## 2019-01-02 DIAGNOSIS — N501 Vascular disorders of male genital organs: Secondary | ICD-10-CM

## 2019-01-02 DIAGNOSIS — G894 Chronic pain syndrome: Secondary | ICD-10-CM | POA: Diagnosis not present

## 2019-01-02 DIAGNOSIS — S3210XS Unspecified fracture of sacrum, sequela: Secondary | ICD-10-CM

## 2019-01-02 MED ORDER — HYDROCODONE-ACETAMINOPHEN 5-325 MG PO TABS: 1.0000 | ORAL_TABLET | Freq: Three times a day (TID) | ORAL | 0 refills | Status: DC | PRN

## 2019-01-02 MED ORDER — HYDROCODONE-ACETAMINOPHEN 5-325 MG PO TABS: ORAL_TABLET | ORAL | 0 refills | Status: DC
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
## 2019-01-04 LAB — PAIN MGMT, PROFILE 6 CONF W/O MM, U
6 Acetylmorphine: NEGATIVE ng/mL
Alcohol Metabolites: NEGATIVE ng/mL (ref <500)
Alphahydroxyalprazolam: 99 ng/mL
Alphahydroxymidazolam: NEGATIVE ng/mL
Alphahydroxytriazolam: NEGATIVE ng/mL
Aminoclonazepam: NEGATIVE ng/mL
Amphetamines: NEGATIVE ng/mL
Barbiturates: NEGATIVE ng/mL
Benzodiazepines: POSITIVE ng/mL
Cocaine Metabolite: NEGATIVE ng/mL
Creatinine: 235.3 mg/dL
Hydroxyethylflurazepam: NEGATIVE ng/mL
Lorazepam: NEGATIVE ng/mL
Marijuana Metabolite: NEGATIVE ng/mL
Methadone Metabolite: NEGATIVE ng/mL
Nordiazepam: NEGATIVE ng/mL
Opiates: NEGATIVE ng/mL
Oxazepam: NEGATIVE ng/mL
Oxidant: NEGATIVE ug/mL
Oxycodone: NEGATIVE ng/mL
Phencyclidine: NEGATIVE ng/mL
Temazepam: NEGATIVE ng/mL
pH: 5 (ref 4.5–9.0)

## 2019-01-08 NOTE — Progress Notes (Signed)
Your norco did not show up in drug screen. When was the last time you took it.

## 2019-01-10 ENCOUNTER — Encounter: Payer: Self-pay | Admitting: Neurology

## 2019-03-01 ENCOUNTER — Other Ambulatory Visit: Payer: Self-pay

## 2019-03-01 DIAGNOSIS — F411 Generalized anxiety disorder: Secondary | ICD-10-CM

## 2019-03-01 MED ORDER — ALPRAZOLAM 1 MG PO TABS: 1.0000 mg | ORAL_TABLET | Freq: Every day | ORAL | 1 refills | Status: DC

## 2019-03-01 NOTE — Telephone Encounter (Signed)
Patient's wife sent MyChart msg asking for refill of husbands Xanax  Last RX was written 08/15/18 with 5 additional refills  Pt should not be due until 03/07/19.Marland Kitchen  Last OV was 01/20/19  Please advise if this is appropriate

## 2019-03-01 NOTE — Telephone Encounter (Signed)
Will post date to 03/07/19. Due for follow up in 12/20.

## 2019-03-15 ENCOUNTER — Other Ambulatory Visit: Payer: Self-pay

## 2019-03-15 DIAGNOSIS — E538 Deficiency of other specified B group vitamins: Secondary | ICD-10-CM

## 2019-03-15 MED ORDER — "NEEDLE (DISP) 25G X 1"" MISC": 1 refills | Status: DC

## 2019-03-15 MED ORDER — CYANOCOBALAMIN 1000 MCG/ML IJ SOLN: 1000.0000 ug | INTRAMUSCULAR | 0 refills | Status: DC

## 2019-03-15 MED ORDER — "NEEDLE (DISP) 18G X 1"" MISC": 0 refills | Status: DC

## 2019-03-15 MED ORDER — "SYRINGE 20G X 1"" 3 ML MISC": 0 refills | Status: DC

## 2019-04-05 ENCOUNTER — Other Ambulatory Visit: Payer: Self-pay | Admitting: Physician Assistant

## 2019-04-05 MED ORDER — HYDROCODONE-ACETAMINOPHEN 5-325 MG PO TABS: 1.0000 | ORAL_TABLET | Freq: Three times a day (TID) | ORAL | 0 refills | Status: DC | PRN

## 2019-04-05 NOTE — Telephone Encounter (Signed)
Needs visit

## 2019-04-05 NOTE — Telephone Encounter (Signed)
..  PDMP reviewed during this encounter. Refilled norco. Follow up with appt.

## 2019-05-04 ENCOUNTER — Other Ambulatory Visit: Payer: Self-pay | Admitting: Physician Assistant

## 2019-05-05 ENCOUNTER — Other Ambulatory Visit: Payer: Self-pay | Admitting: Physician Assistant

## 2019-05-06 ENCOUNTER — Encounter: Payer: Self-pay | Admitting: Physician Assistant

## 2019-05-07 ENCOUNTER — Encounter: Payer: Self-pay | Admitting: Physician Assistant

## 2019-05-07 ENCOUNTER — Ambulatory Visit (INDEPENDENT_AMBULATORY_CARE_PROVIDER_SITE_OTHER): Payer: BLUE CROSS/BLUE SHIELD | Admitting: Physician Assistant

## 2019-05-07 VITALS — Temp 97.8°F | Ht 67.0 in | Wt 225.0 lb

## 2019-05-07 DIAGNOSIS — F411 Generalized anxiety disorder: Secondary | ICD-10-CM

## 2019-05-07 DIAGNOSIS — R102 Pelvic and perineal pain: Secondary | ICD-10-CM | POA: Diagnosis not present

## 2019-05-07 DIAGNOSIS — S32810S Multiple fractures of pelvis with stable disruption of pelvic ring, sequela: Secondary | ICD-10-CM | POA: Diagnosis not present

## 2019-05-07 DIAGNOSIS — F419 Anxiety disorder, unspecified: Secondary | ICD-10-CM

## 2019-05-07 DIAGNOSIS — S3210XS Unspecified fracture of sacrum, sequela: Secondary | ICD-10-CM

## 2019-05-07 DIAGNOSIS — N501 Vascular disorders of male genital organs: Secondary | ICD-10-CM

## 2019-05-07 MED ORDER — HYDROCODONE-ACETAMINOPHEN 5-325 MG PO TABS: ORAL_TABLET | ORAL | 0 refills | Status: DC

## 2019-05-07 MED ORDER — CYCLOBENZAPRINE HCL 10 MG PO TABS: ORAL_TABLET | ORAL | 5 refills | Status: DC

## 2019-05-07 MED ORDER — DICLOFENAC SODIUM 75 MG PO TBEC: 75.0000 mg | DELAYED_RELEASE_TABLET | Freq: Two times a day (BID) | ORAL | 5 refills | Status: DC

## 2019-05-07 MED ORDER — ALPRAZOLAM 1 MG PO TABS: 1.0000 mg | ORAL_TABLET | Freq: Every day | ORAL | 5 refills | Status: DC
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
## 2019-05-07 NOTE — Progress Notes (Signed)
Pain contract needs updated. Last 03/14/2018.  Needs refill pain meds/xanax (last filled 03/07/2019 #30 with one refill).  PHQ9 (5) -GAD7 (15) completed.    Had Covid 3 weeks ago. Better now.

## 2019-06-05 ENCOUNTER — Other Ambulatory Visit: Payer: Self-pay | Admitting: Physician Assistant

## 2019-06-05 DIAGNOSIS — R102 Pelvic and perineal pain: Secondary | ICD-10-CM

## 2019-06-05 DIAGNOSIS — S32810S Multiple fractures of pelvis with stable disruption of pelvic ring, sequela: Secondary | ICD-10-CM

## 2019-06-05 DIAGNOSIS — N501 Vascular disorders of male genital organs: Secondary | ICD-10-CM

## 2019-06-05 DIAGNOSIS — S3210XS Unspecified fracture of sacrum, sequela: Secondary | ICD-10-CM

## 2019-06-06 ENCOUNTER — Other Ambulatory Visit: Payer: Self-pay | Admitting: Physician Assistant

## 2019-06-06 DIAGNOSIS — S3210XS Unspecified fracture of sacrum, sequela: Secondary | ICD-10-CM

## 2019-06-06 DIAGNOSIS — N501 Vascular disorders of male genital organs: Secondary | ICD-10-CM

## 2019-06-06 DIAGNOSIS — S32810S Multiple fractures of pelvis with stable disruption of pelvic ring, sequela: Secondary | ICD-10-CM

## 2019-06-06 DIAGNOSIS — R102 Pelvic and perineal pain: Secondary | ICD-10-CM

## 2019-06-07 ENCOUNTER — Encounter: Payer: Self-pay | Admitting: Physician Assistant

## 2019-06-07 MED ORDER — HYDROCODONE-ACETAMINOPHEN 5-325 MG PO TABS: ORAL_TABLET | ORAL | 0 refills | Status: DC

## 2019-07-04 ENCOUNTER — Other Ambulatory Visit: Payer: Self-pay | Admitting: Physician Assistant

## 2019-07-04 DIAGNOSIS — S3210XS Unspecified fracture of sacrum, sequela: Secondary | ICD-10-CM

## 2019-07-04 DIAGNOSIS — S32810S Multiple fractures of pelvis with stable disruption of pelvic ring, sequela: Secondary | ICD-10-CM

## 2019-07-04 DIAGNOSIS — R102 Pelvic and perineal pain: Secondary | ICD-10-CM

## 2019-07-04 DIAGNOSIS — N501 Vascular disorders of male genital organs: Secondary | ICD-10-CM

## 2019-07-05 ENCOUNTER — Encounter: Payer: Self-pay | Admitting: Physician Assistant

## 2019-07-05 MED ORDER — HYDROCODONE-ACETAMINOPHEN 5-325 MG PO TABS: ORAL_TABLET | ORAL | 0 refills | Status: DC

## 2019-07-05 NOTE — Telephone Encounter (Signed)
Called patient and LM on VM of instructions to call office back and schedule an appointment. KG LPN

## 2019-07-05 NOTE — Telephone Encounter (Signed)
Needs follow-up

## 2019-07-12 ENCOUNTER — Telehealth: Payer: Self-pay | Admitting: Physician Assistant

## 2019-07-12 NOTE — Telephone Encounter (Signed)
Received fax for PA on Hydrocodone-Acetaminophen sent through cover my meds and received authorization.   Valid 07/12/2019 - 08/10/2019 - CF

## 2019-08-05 ENCOUNTER — Other Ambulatory Visit: Payer: Self-pay | Admitting: Physician Assistant

## 2019-08-05 ENCOUNTER — Encounter: Payer: Self-pay | Admitting: Physician Assistant

## 2019-08-05 DIAGNOSIS — S3210XS Unspecified fracture of sacrum, sequela: Secondary | ICD-10-CM

## 2019-08-05 DIAGNOSIS — N501 Vascular disorders of male genital organs: Secondary | ICD-10-CM

## 2019-08-05 DIAGNOSIS — S32810S Multiple fractures of pelvis with stable disruption of pelvic ring, sequela: Secondary | ICD-10-CM

## 2019-08-05 DIAGNOSIS — R102 Pelvic and perineal pain: Secondary | ICD-10-CM

## 2019-08-06 ENCOUNTER — Other Ambulatory Visit: Payer: Self-pay | Admitting: Physician Assistant

## 2019-08-06 DIAGNOSIS — R102 Pelvic and perineal pain: Secondary | ICD-10-CM

## 2019-08-06 DIAGNOSIS — N501 Vascular disorders of male genital organs: Secondary | ICD-10-CM

## 2019-08-06 DIAGNOSIS — S32810S Multiple fractures of pelvis with stable disruption of pelvic ring, sequela: Secondary | ICD-10-CM

## 2019-08-06 DIAGNOSIS — S3210XS Unspecified fracture of sacrum, sequela: Secondary | ICD-10-CM

## 2019-08-06 NOTE — Telephone Encounter (Signed)
Duplicate med refill request. Unable to refuse order, assistant not granted access.

## 2019-08-06 NOTE — Telephone Encounter (Signed)
[facility] requesting med refill for hydrocodone-ace.

## 2019-08-14 ENCOUNTER — Other Ambulatory Visit: Payer: Self-pay

## 2019-08-14 ENCOUNTER — Ambulatory Visit (INDEPENDENT_AMBULATORY_CARE_PROVIDER_SITE_OTHER): Payer: BLUE CROSS/BLUE SHIELD | Admitting: Physician Assistant

## 2019-08-14 VITALS — BP 144/82 | HR 76 | Ht 67.0 in | Wt 230.0 lb

## 2019-08-14 DIAGNOSIS — R102 Pelvic and perineal pain unspecified side: Secondary | ICD-10-CM

## 2019-08-14 DIAGNOSIS — S3210XS Unspecified fracture of sacrum, sequela: Secondary | ICD-10-CM | POA: Diagnosis not present

## 2019-08-14 DIAGNOSIS — Z131 Encounter for screening for diabetes mellitus: Secondary | ICD-10-CM

## 2019-08-14 DIAGNOSIS — S32810S Multiple fractures of pelvis with stable disruption of pelvic ring, sequela: Secondary | ICD-10-CM | POA: Diagnosis not present

## 2019-08-14 DIAGNOSIS — N501 Vascular disorders of male genital organs: Secondary | ICD-10-CM

## 2019-08-14 DIAGNOSIS — H6981 Other specified disorders of Eustachian tube, right ear: Secondary | ICD-10-CM

## 2019-08-14 DIAGNOSIS — Z13220 Encounter for screening for lipoid disorders: Secondary | ICD-10-CM

## 2019-08-14 DIAGNOSIS — H6991 Unspecified Eustachian tube disorder, right ear: Secondary | ICD-10-CM

## 2019-08-14 DIAGNOSIS — E538 Deficiency of other specified B group vitamins: Secondary | ICD-10-CM

## 2019-08-14 DIAGNOSIS — K2100 Gastro-esophageal reflux disease with esophagitis, without bleeding: Secondary | ICD-10-CM

## 2019-08-14 MED ORDER — FLUTICASONE PROPIONATE 50 MCG/ACT NA SUSP: 2.0000 | Freq: Every day | NASAL | 6 refills | Status: DC

## 2019-08-14 MED ORDER — IBUPROFEN 800 MG PO TABS: 800.0000 mg | ORAL_TABLET | Freq: Three times a day (TID) | ORAL | 1 refills | Status: DC | PRN

## 2019-08-14 MED ORDER — OMEPRAZOLE 40 MG PO CPDR: 40.0000 mg | DELAYED_RELEASE_CAPSULE | Freq: Every day | ORAL | 4 refills | Status: DC

## 2019-08-14 MED ORDER — CYANOCOBALAMIN 1000 MCG/ML IJ SOLN: 1000.0000 ug | INTRAMUSCULAR | 0 refills | Status: DC

## 2019-08-14 MED ORDER — METHYLPREDNISOLONE 4 MG PO TBPK: ORAL_TABLET | ORAL | 0 refills | Status: DC
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
## 2019-08-14 NOTE — Patient Instructions (Signed)
Eustachian Tube Dysfunction ° °Eustachian tube dysfunction refers to a condition in which a blockage develops in the narrow passage that connects the middle ear to the back of the nose (eustachian tube). The eustachian tube regulates air pressure in the middle ear by letting air move between the ear and nose. It also helps to drain fluid from the middle ear space. °Eustachian tube dysfunction can affect one or both ears. When the eustachian tube does not function properly, air pressure, fluid, or both can build up in the middle ear. °What are the causes? °This condition occurs when the eustachian tube becomes blocked or cannot open normally. Common causes of this condition include: °· Ear infections. °· Colds and other infections that affect the nose, mouth, and throat (upper respiratory tract). °· Allergies. °· Irritation from cigarette smoke. °· Irritation from stomach acid coming up into the esophagus (gastroesophageal reflux). The esophagus is the tube that carries food from the mouth to the stomach. °· Sudden changes in air pressure, such as from descending in an airplane or scuba diving. °· Abnormal growths in the nose or throat, such as: °? Growths that line the nose (nasal polyps). °? Abnormal growth of cells (tumors). °? Enlarged tissue at the back of the throat (adenoids). °What increases the risk? °You are more likely to develop this condition if: °· You smoke. °· You are overweight. °· You are a child who has: °? Certain birth defects of the mouth, such as cleft palate. °? Large tonsils or adenoids. °What are the signs or symptoms? °Common symptoms of this condition include: °· A feeling of fullness in the ear. °· Ear pain. °· Clicking or popping noises in the ear. °· Ringing in the ear. °· Hearing loss. °· Loss of balance. °· Dizziness. °Symptoms may get worse when the air pressure around you changes, such as when you travel to an area of high elevation, fly on an airplane, or go scuba diving. °How is  this diagnosed? °This condition may be diagnosed based on: °· Your symptoms. °· A physical exam of your ears, nose, and throat. °· Tests, such as those that measure: °? The movement of your eardrum (tympanogram). °? Your hearing (audiometry). °How is this treated? °Treatment depends on the cause and severity of your condition. °· In mild cases, you may relieve your symptoms by moving air into your ears. This is called "popping the ears." °· In more severe cases, or if you have symptoms of fluid in your ears, treatment may include: °? Medicines to relieve congestion (decongestants). °? Medicines that treat allergies (antihistamines). °? Nasal sprays or ear drops that contain medicines that reduce swelling (steroids). °? A procedure to drain the fluid in your eardrum (myringotomy). In this procedure, a small tube is placed in the eardrum to: °§ Drain the fluid. °§ Restore the air in the middle ear space. °? A procedure to insert a balloon device through the nose to inflate the opening of the eustachian tube (balloon dilation). °Follow these instructions at home: °Lifestyle °· Do not do any of the following until your health care provider approves: °? Travel to high altitudes. °? Fly in airplanes. °? Work in a pressurized cabin or room. °? Scuba dive. °· Do not use any products that contain nicotine or tobacco, such as cigarettes and e-cigarettes. If you need help quitting, ask your health care provider. °· Keep your ears dry. Wear fitted earplugs during showering and bathing. Dry your ears completely after. °General instructions °· Take over-the-counter   and prescription medicines only as told by your health care provider. °· Use techniques to help pop your ears as recommended by your health care provider. These may include: °? Chewing gum. °? Yawning. °? Frequent, forceful swallowing. °? Closing your mouth, holding your nose closed, and gently blowing as if you are trying to blow air out of your nose. °· Keep all  follow-up visits as told by your health care provider. This is important. °Contact a health care provider if: °· Your symptoms do not go away after treatment. °· Your symptoms come back after treatment. °· You are unable to pop your ears. °· You have: °? A fever. °? Pain in your ear. °? Pain in your head or neck. °? Fluid draining from your ear. °· Your hearing suddenly changes. °· You become very dizzy. °· You lose your balance. °Summary °· Eustachian tube dysfunction refers to a condition in which a blockage develops in the eustachian tube. °· It can be caused by ear infections, allergies, inhaled irritants, or abnormal growths in the nose or throat. °· Symptoms include ear pain, hearing loss, or ringing in the ears. °· Mild cases are treated with maneuvers to unblock the ears, such as yawning or ear popping. °· Severe cases are treated with medicines. Surgery may also be done (rare). °This information is not intended to replace advice given to you by your health care provider. Make sure you discuss any questions you have with your health care provider. °Document Revised: 08/08/2017 Document Reviewed: 08/08/2017 °Elsevier Patient Education © 2020 [employer] ° °

## 2019-08-16 ENCOUNTER — Encounter: Payer: Self-pay | Admitting: Physician Assistant

## 2019-11-14 ENCOUNTER — Other Ambulatory Visit: Payer: Self-pay | Admitting: Physician Assistant

## 2019-11-14 DIAGNOSIS — F411 Generalized anxiety disorder: Secondary | ICD-10-CM

## 2019-11-14 NOTE — Telephone Encounter (Signed)
Last filled 05/07/2019 #30 with 5 refills.  Last visit 08/14/2019

## 2019-11-15 ENCOUNTER — Encounter: Payer: Self-pay | Admitting: Physician Assistant

## 2019-11-15 ENCOUNTER — Telehealth (INDEPENDENT_AMBULATORY_CARE_PROVIDER_SITE_OTHER): Payer: BLUE CROSS/BLUE SHIELD | Admitting: Physician Assistant

## 2019-11-15 VITALS — Wt 227.0 lb

## 2019-11-15 DIAGNOSIS — E538 Deficiency of other specified B group vitamins: Secondary | ICD-10-CM | POA: Diagnosis not present

## 2019-11-15 DIAGNOSIS — K2100 Gastro-esophageal reflux disease with esophagitis, without bleeding: Secondary | ICD-10-CM

## 2019-11-15 DIAGNOSIS — S32810S Multiple fractures of pelvis with stable disruption of pelvic ring, sequela: Secondary | ICD-10-CM | POA: Diagnosis not present

## 2019-11-15 DIAGNOSIS — M25471 Effusion, right ankle: Secondary | ICD-10-CM | POA: Insufficient documentation

## 2019-11-15 DIAGNOSIS — Z13220 Encounter for screening for lipoid disorders: Secondary | ICD-10-CM

## 2019-11-15 DIAGNOSIS — R102 Pelvic and perineal pain: Secondary | ICD-10-CM

## 2019-11-15 DIAGNOSIS — F411 Generalized anxiety disorder: Secondary | ICD-10-CM

## 2019-11-15 DIAGNOSIS — E349 Endocrine disorder, unspecified: Secondary | ICD-10-CM

## 2019-11-15 DIAGNOSIS — R252 Cramp and spasm: Secondary | ICD-10-CM

## 2019-11-15 DIAGNOSIS — S3210XS Unspecified fracture of sacrum, sequela: Secondary | ICD-10-CM

## 2019-11-15 DIAGNOSIS — Z1159 Encounter for screening for other viral diseases: Secondary | ICD-10-CM

## 2019-11-15 DIAGNOSIS — Z131 Encounter for screening for diabetes mellitus: Secondary | ICD-10-CM

## 2019-11-15 DIAGNOSIS — H6981 Other specified disorders of Eustachian tube, right ear: Secondary | ICD-10-CM

## 2019-11-15 DIAGNOSIS — N501 Vascular disorders of male genital organs: Secondary | ICD-10-CM

## 2019-11-15 MED ORDER — CYCLOBENZAPRINE HCL 10 MG PO TABS: ORAL_TABLET | ORAL | 5 refills | Status: DC

## 2019-11-15 MED ORDER — CYANOCOBALAMIN 1000 MCG/ML IJ SOLN: 1000.0000 ug | INTRAMUSCULAR | 1 refills | Status: DC

## 2019-11-15 MED ORDER — OMEPRAZOLE 40 MG PO CPDR: 40.0000 mg | DELAYED_RELEASE_CAPSULE | Freq: Every day | ORAL | 4 refills | Status: DC

## 2019-11-15 MED ORDER — LEVOCETIRIZINE DIHYDROCHLORIDE 5 MG PO TABS: 5.0000 mg | ORAL_TABLET | Freq: Every evening | ORAL | 4 refills | Status: DC

## 2019-11-15 MED ORDER — FLUTICASONE PROPIONATE 50 MCG/ACT NA SUSP: 2.0000 | Freq: Every day | NASAL | 6 refills | Status: DC

## 2019-11-15 MED ORDER — ALPRAZOLAM 1 MG PO TABS: 1.0000 mg | ORAL_TABLET | Freq: Every day | ORAL | 5 refills | Status: DC

## 2019-11-15 MED ORDER — IBUPROFEN 800 MG PO TABS: 800.0000 mg | ORAL_TABLET | Freq: Three times a day (TID) | ORAL | 1 refills | Status: DC | PRN

## 2019-11-15 NOTE — Patient Instructions (Signed)
Magnesium 500 mg daily.
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
## 2019-12-05 ENCOUNTER — Encounter: Payer: Self-pay | Admitting: Physician Assistant

## 2019-12-06 MED ORDER — CLOTRIMAZOLE-BETAMETHASONE 1-0.05 % EX CREA
1.0000 | TOPICAL_CREAM | Freq: Two times a day (BID) | CUTANEOUS | 0 refills | Status: DC
Start: 2019-12-06 — End: 2020-11-11

## 2019-12-06 NOTE — Telephone Encounter (Signed)
l °

## 2020-02-19 ENCOUNTER — Encounter: Payer: Self-pay | Admitting: Physician Assistant

## 2020-02-19 MED ORDER — ONDANSETRON 8 MG PO TBDP
8.0000 mg | ORAL_TABLET | Freq: Three times a day (TID) | ORAL | 1 refills | Status: DC | PRN
Start: 2020-02-19 — End: 2020-07-06

## 2020-02-21 ENCOUNTER — Encounter: Payer: Self-pay | Admitting: Physician Assistant

## 2020-02-21 ENCOUNTER — Ambulatory Visit (INDEPENDENT_AMBULATORY_CARE_PROVIDER_SITE_OTHER): Payer: BLUE CROSS/BLUE SHIELD | Admitting: Physician Assistant

## 2020-02-21 ENCOUNTER — Other Ambulatory Visit: Payer: Self-pay

## 2020-02-21 VITALS — BP 128/72 | HR 78 | Ht 67.0 in | Wt 239.0 lb

## 2020-02-21 DIAGNOSIS — Z86718 Personal history of other venous thrombosis and embolism: Secondary | ICD-10-CM

## 2020-02-21 DIAGNOSIS — K921 Melena: Secondary | ICD-10-CM

## 2020-02-21 DIAGNOSIS — I82521 Chronic embolism and thrombosis of right iliac vein: Secondary | ICD-10-CM | POA: Diagnosis not present

## 2020-02-21 DIAGNOSIS — K649 Unspecified hemorrhoids: Secondary | ICD-10-CM

## 2020-02-21 DIAGNOSIS — M79604 Pain in right leg: Secondary | ICD-10-CM | POA: Diagnosis not present

## 2020-02-21 MED ORDER — HYDROCORTISONE (PERIANAL) 2.5 % EX CREA: 1.0000 "application " | TOPICAL_CREAM | Freq: Two times a day (BID) | CUTANEOUS | 5 refills | Status: DC

## 2020-02-21 MED ORDER — TRAMADOL HCL 50 MG PO TABS: ORAL_TABLET | ORAL | 0 refills | Status: DC

## 2020-02-21 MED ORDER — RIVAROXABAN 20 MG PO TABS: 20.0000 mg | ORAL_TABLET | Freq: Every day | ORAL | 0 refills | Status: DC
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
## 2020-02-21 NOTE — Telephone Encounter (Signed)
FYI

## 2020-02-21 NOTE — Patient Instructions (Signed)

## 2020-02-22 DIAGNOSIS — M79604 Pain in right leg: Secondary | ICD-10-CM | POA: Insufficient documentation

## 2020-02-22 DIAGNOSIS — Z86718 Personal history of other venous thrombosis and embolism: Secondary | ICD-10-CM | POA: Insufficient documentation

## 2020-02-22 DIAGNOSIS — K921 Melena: Secondary | ICD-10-CM | POA: Insufficient documentation

## 2020-02-22 DIAGNOSIS — I82521 Chronic embolism and thrombosis of right iliac vein: Secondary | ICD-10-CM | POA: Insufficient documentation

## 2020-02-24 ENCOUNTER — Telehealth: Payer: Self-pay | Admitting: Family

## 2020-02-24 NOTE — Telephone Encounter (Signed)
Called and LMVM for patient regarding New Patient appointment scheduled.  New patient Packet w/ Calendar was mailed to patient.  I asked that he call the office should he have any questions

## 2020-02-25 MED ORDER — HYDROCODONE-ACETAMINOPHEN 5-325 MG PO TABS: 1.0000 | ORAL_TABLET | Freq: Four times a day (QID) | ORAL | 0 refills | Status: DC | PRN

## 2020-02-25 NOTE — Addendum Note (Signed)
Addended by: Jomarie Longs on: 02/25/2020 05:09 PM   Modules accepted: Orders

## 2020-03-16 ENCOUNTER — Other Ambulatory Visit: Payer: Self-pay | Admitting: Family

## 2020-03-16 ENCOUNTER — Inpatient Hospital Stay: Payer: BLUE CROSS/BLUE SHIELD | Attending: Physician Assistant

## 2020-03-16 ENCOUNTER — Inpatient Hospital Stay: Payer: BLUE CROSS/BLUE SHIELD | Admitting: Family

## 2020-03-16 DIAGNOSIS — Z86718 Personal history of other venous thrombosis and embolism: Secondary | ICD-10-CM

## 2020-03-16 DIAGNOSIS — D6859 Other primary thrombophilia: Secondary | ICD-10-CM

## 2020-03-25 ENCOUNTER — Encounter: Payer: Self-pay | Admitting: Physician Assistant

## 2020-03-25 ENCOUNTER — Other Ambulatory Visit: Payer: Self-pay | Admitting: Medical-Surgical

## 2020-03-25 MED ORDER — HYDROCODONE-ACETAMINOPHEN 5-325 MG PO TABS: 1.0000 | ORAL_TABLET | Freq: Two times a day (BID) | ORAL | 0 refills | Status: DC | PRN

## 2020-03-25 NOTE — Telephone Encounter (Signed)
Last RX sent 02/25/20 for #20 1 po Q6H PRN x5 days  RX pended   See last visit from 10/22.  Note to covering Provider

## 2020-03-25 NOTE — Telephone Encounter (Signed)
See other message

## 2020-04-05 ENCOUNTER — Encounter: Payer: Self-pay | Admitting: Physician Assistant

## 2020-04-05 DIAGNOSIS — I82521 Chronic embolism and thrombosis of right iliac vein: Secondary | ICD-10-CM

## 2020-04-05 DIAGNOSIS — G894 Chronic pain syndrome: Secondary | ICD-10-CM

## 2020-04-06 MED ORDER — HYDROCODONE-ACETAMINOPHEN 5-325 MG PO TABS: ORAL_TABLET | ORAL | 0 refills | Status: DC

## 2020-04-06 NOTE — Telephone Encounter (Signed)
11/24 #10 of norco picked up from the pharmacy.   ..PDMP reviewed during this encounter.   Going to need to get set up for pain contract.   #30 NRF without another appt.

## 2020-05-06 ENCOUNTER — Other Ambulatory Visit: Payer: Self-pay | Admitting: Physician Assistant

## 2020-05-06 MED ORDER — HYDROCODONE-ACETAMINOPHEN 5-325 MG PO TABS: ORAL_TABLET | ORAL | 0 refills | Status: DC

## 2020-05-06 NOTE — Telephone Encounter (Signed)
He has an appointment on 05/29/2020.

## 2020-05-20 ENCOUNTER — Encounter: Payer: Self-pay | Admitting: Physician Assistant

## 2020-05-20 ENCOUNTER — Other Ambulatory Visit: Payer: Self-pay

## 2020-05-20 ENCOUNTER — Ambulatory Visit (INDEPENDENT_AMBULATORY_CARE_PROVIDER_SITE_OTHER): Payer: BLUE CROSS/BLUE SHIELD | Admitting: Physician Assistant

## 2020-05-20 VITALS — BP 131/85 | HR 84 | Wt 238.6 lb

## 2020-05-20 DIAGNOSIS — Z86718 Personal history of other venous thrombosis and embolism: Secondary | ICD-10-CM

## 2020-05-20 DIAGNOSIS — Z131 Encounter for screening for diabetes mellitus: Secondary | ICD-10-CM

## 2020-05-20 DIAGNOSIS — Z79899 Other long term (current) drug therapy: Secondary | ICD-10-CM | POA: Diagnosis not present

## 2020-05-20 DIAGNOSIS — Z1159 Encounter for screening for other viral diseases: Secondary | ICD-10-CM

## 2020-05-20 DIAGNOSIS — Z13220 Encounter for screening for lipoid disorders: Secondary | ICD-10-CM

## 2020-05-20 DIAGNOSIS — G894 Chronic pain syndrome: Secondary | ICD-10-CM

## 2020-05-20 DIAGNOSIS — K219 Gastro-esophageal reflux disease without esophagitis: Secondary | ICD-10-CM

## 2020-05-20 DIAGNOSIS — M5136 Other intervertebral disc degeneration, lumbar region: Secondary | ICD-10-CM

## 2020-05-20 DIAGNOSIS — I82521 Chronic embolism and thrombosis of right iliac vein: Secondary | ICD-10-CM

## 2020-05-20 DIAGNOSIS — E291 Testicular hypofunction: Secondary | ICD-10-CM

## 2020-05-20 MED ORDER — HYDROCODONE-ACETAMINOPHEN 5-325 MG PO TABS: ORAL_TABLET | ORAL | 0 refills | Status: DC
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
## 2020-05-21 ENCOUNTER — Other Ambulatory Visit: Payer: Self-pay | Admitting: Physician Assistant

## 2020-05-21 DIAGNOSIS — F411 Generalized anxiety disorder: Secondary | ICD-10-CM

## 2020-05-21 MED ORDER — RIVAROXABAN 20 MG PO TABS
20.0000 mg | ORAL_TABLET | Freq: Every day | ORAL | 0 refills | Status: DC
Start: 2020-05-21 — End: 2020-08-28

## 2020-05-21 NOTE — Telephone Encounter (Signed)
Last written 11/15/2019 #30 with 5 refills Last appt yesterday

## 2020-05-22 ENCOUNTER — Encounter: Payer: Self-pay | Admitting: Physician Assistant

## 2020-05-23 LAB — DRUG MONITORING, PANEL 6 WITH CONFIRMATION, URINE
6 Acetylmorphine: NEGATIVE ng/mL (ref <10)
Alcohol Metabolites: NEGATIVE ng/mL
Alphahydroxyalprazolam: 95 ng/mL — ABNORMAL HIGH (ref <25)
Alphahydroxymidazolam: NEGATIVE ng/mL (ref <50)
Alphahydroxytriazolam: NEGATIVE ng/mL (ref <50)
Aminoclonazepam: NEGATIVE ng/mL (ref <25)
Amphetamines: NEGATIVE ng/mL (ref <500)
Barbiturates: NEGATIVE ng/mL (ref <300)
Benzodiazepines: POSITIVE ng/mL — AB (ref <100)
Cocaine Metabolite: NEGATIVE ng/mL (ref <150)
Creatinine: 178 mg/dL
Hydroxyethylflurazepam: NEGATIVE ng/mL (ref <50)
Lorazepam: NEGATIVE ng/mL (ref <50)
Marijuana Metabolite: NEGATIVE ng/mL (ref <20)
Methadone Metabolite: NEGATIVE ng/mL (ref <100)
Nordiazepam: NEGATIVE ng/mL (ref <50)
Opiates: NEGATIVE ng/mL (ref <100)
Oxazepam: NEGATIVE ng/mL (ref <50)
Oxidant: NEGATIVE ug/mL
Oxycodone: NEGATIVE ng/mL (ref <100)
Phencyclidine: NEGATIVE ng/mL (ref <25)
Temazepam: NEGATIVE ng/mL (ref <50)
pH: 5.1 (ref 4.5–9.0)

## 2020-05-23 LAB — DM TEMPLATE

## 2020-05-25 ENCOUNTER — Encounter: Payer: Self-pay | Admitting: Physician Assistant

## 2020-05-25 NOTE — Progress Notes (Signed)
You are on a pain contract and testing negative for opiates? Since you are getting regular monthly refills can you explain why not showing up in urine?

## 2020-05-29 ENCOUNTER — Ambulatory Visit: Payer: BLUE CROSS/BLUE SHIELD | Admitting: Physician Assistant

## 2020-07-06 ENCOUNTER — Other Ambulatory Visit: Payer: Self-pay | Admitting: Physician Assistant

## 2020-08-28 ENCOUNTER — Encounter: Payer: Self-pay | Admitting: Physician Assistant

## 2020-08-28 ENCOUNTER — Telehealth (INDEPENDENT_AMBULATORY_CARE_PROVIDER_SITE_OTHER): Payer: BLUE CROSS/BLUE SHIELD | Admitting: Physician Assistant

## 2020-08-28 VITALS — Ht 67.0 in | Wt 238.0 lb

## 2020-08-28 DIAGNOSIS — G894 Chronic pain syndrome: Secondary | ICD-10-CM | POA: Diagnosis not present

## 2020-08-28 DIAGNOSIS — M4310 Spondylolisthesis, site unspecified: Secondary | ICD-10-CM

## 2020-08-28 DIAGNOSIS — G8929 Other chronic pain: Secondary | ICD-10-CM | POA: Insufficient documentation

## 2020-08-28 DIAGNOSIS — R102 Pelvic and perineal pain: Secondary | ICD-10-CM | POA: Diagnosis not present

## 2020-08-28 DIAGNOSIS — M5136 Other intervertebral disc degeneration, lumbar region: Secondary | ICD-10-CM

## 2020-08-28 MED ORDER — HYDROCODONE-ACETAMINOPHEN 5-325 MG PO TABS: ORAL_TABLET | ORAL | 0 refills | Status: DC

## 2020-08-28 NOTE — Progress Notes (Signed)
Patient ID: Luke Villarreal, male   DOB: [birth year], 45 y.o.   MRN: [id number] .Marland KitchenVirtual Visit via Telephone Note  I connected with Luke Villarreal on 08/28/20 at  2:20 PM EDT by telephone and verified that I am speaking with the correct person using two identifiers.  Location: Patient: home Provider: clinic  .Marland KitchenParticipating in visit:  Patient: Luke Villarreal Provider: Tandy Gaw PA-C   I discussed the limitations, risks, security and privacy concerns of performing an evaluation and management service by telephone and the availability of in person appointments. I also discussed with the patient that there may be a patient responsible charge related to this service. The patient expressed understanding and agreed to proceed.   History of Present Illness: Patient is a 45 year old obese male with chronic pain due to pelvic fractures years ago and lumbar degenerative disc disease who presents to the clinic for medication refills.  Patient takes Norco when he gets home from work 1 tablet and Xanax in the mornings for anxiety.  This combination is working well.  He denies any concerns or complaints.  He is working full-time without difficulty.   .. Active Ambulatory Problems    Diagnosis Date Noted  . Gastroesophageal reflux disease without esophagitis 09/21/2015  . Rhinitis, allergic 09/21/2015  . Panic attacks 09/21/2015  . Seasonal allergies 09/21/2015  . Hemorrhoid 09/21/2015  . History of asthma 09/21/2015  . Pelvic pain 11/25/2015  . Bilateral carpal tunnel syndrome 11/25/2015  . Hematuria 11/25/2015  . Hypertriglyceridemia 11/26/2015  . Low HDL (under 40) 11/26/2015  . Anterolisthesis 11/26/2015  . Male hypogonadism 03/08/2016  . B12 deficiency 09/20/2016  . Anxiety state 09/20/2016  . Closed pelvic ring fracture (HCC) 03/20/2018  . Sacral fracture, closed (HCC) 03/20/2018  . Pelvic hematoma, male 03/20/2018  . Lumbar degenerative disc disease 06/20/2018  . Allergic conjunctivitis of  both eyes 06/20/2018  . Chronic allergic rhinitis due to pollen 06/20/2018  . Chronic pain syndrome 08/18/2018  . Right ankle swelling 11/15/2019  . Muscle cramps 11/15/2019  . 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 History of DVT (deep vein thrombosis) 02/22/2020  . Blood in stool 02/22/2020  . Right leg pain 02/22/2020  . Chronic pelvic pain in male 08/28/2020   Resolved Ambulatory Problems    Diagnosis Date Noted  . Gastroesophageal reflux disease with esophagitis 08/18/2018   No Additional Past Medical History    Observations/Objective: No acute distress Normal mood    Assessment and Plan: Marland KitchenMarland KitchenDvon was seen today for pain.  Diagnoses and all orders for this visit:  Chronic pain syndrome -     HYDROcodone-acetaminophen (NORCO/VICODIN) 5-325 MG tablet; Take one tablet once daily as needed for pain. -     HYDROcodone-acetaminophen (NORCO/VICODIN) 5-325 MG tablet; Take one tablet by mouth once a day. -     HYDROcodone-acetaminophen (NORCO/VICODIN) 5-325 MG tablet; Take one tablet as needed for moderate to severe pain.  Lumbar degenerative disc disease -     HYDROcodone-acetaminophen (NORCO/VICODIN) 5-325 MG tablet; Take one tablet once daily as needed for pain. -     HYDROcodone-acetaminophen (NORCO/VICODIN) 5-325 MG tablet; Take one tablet by mouth once a day. -     HYDROcodone-acetaminophen (NORCO/VICODIN) 5-325 MG tablet; Take one tablet as needed for moderate to severe pain.  Anterolisthesis  Chronic pelvic pain in male   .Marland KitchenPDMP reviewed during this encounter. No concerns. Last fill 08/01/2020. Pain contract UTD.  UDS UTD. Refilled for 3 months.  Pt must follow up  every 3 months for controlled substance refills.     Follow Up Instructions:    I discussed the assessment and treatment plan with the patient. The patient was provided an opportunity to ask questions and all were answered. The patient agreed with the plan and  demonstrated an understanding of the instructions.   The patient was advised to call back or seek an in-person evaluation if the symptoms worsen or if the condition fails to improve as anticipated.  I provided 10 minutes of non-face-to-face time during this encounter.   Luke Gaw, PA-C

## 2020-08-28 NOTE — Progress Notes (Signed)
Pain contract/UDS -  UTD - 05/20/2020 Needs pain med refills No other issues

## 2020-11-11 ENCOUNTER — Telehealth (INDEPENDENT_AMBULATORY_CARE_PROVIDER_SITE_OTHER): Payer: BLUE CROSS/BLUE SHIELD | Admitting: Physician Assistant

## 2020-11-11 ENCOUNTER — Encounter: Payer: Self-pay | Admitting: Physician Assistant

## 2020-11-11 VITALS — Ht 67.0 in | Wt 238.0 lb

## 2020-11-11 DIAGNOSIS — N501 Vascular disorders of male genital organs: Secondary | ICD-10-CM

## 2020-11-11 DIAGNOSIS — F411 Generalized anxiety disorder: Secondary | ICD-10-CM | POA: Diagnosis not present

## 2020-11-11 DIAGNOSIS — R102 Pelvic and perineal pain: Secondary | ICD-10-CM | POA: Diagnosis not present

## 2020-11-11 DIAGNOSIS — M5136 Other intervertebral disc degeneration, lumbar region: Secondary | ICD-10-CM

## 2020-11-11 DIAGNOSIS — H6981 Other specified disorders of Eustachian tube, right ear: Secondary | ICD-10-CM

## 2020-11-11 DIAGNOSIS — G894 Chronic pain syndrome: Secondary | ICD-10-CM

## 2020-11-11 DIAGNOSIS — K2100 Gastro-esophageal reflux disease with esophagitis, without bleeding: Secondary | ICD-10-CM

## 2020-11-11 DIAGNOSIS — S32810S Multiple fractures of pelvis with stable disruption of pelvic ring, sequela: Secondary | ICD-10-CM

## 2020-11-11 DIAGNOSIS — S3210XS Unspecified fracture of sacrum, sequela: Secondary | ICD-10-CM

## 2020-11-11 MED ORDER — CYCLOBENZAPRINE HCL 10 MG PO TABS: ORAL_TABLET | ORAL | 5 refills | Status: DC

## 2020-11-11 MED ORDER — ALPRAZOLAM 1 MG PO TABS: ORAL_TABLET | ORAL | 5 refills | Status: DC

## 2020-11-11 MED ORDER — LEVOCETIRIZINE DIHYDROCHLORIDE 5 MG PO TABS
5.0000 mg | ORAL_TABLET | Freq: Every evening | ORAL | 3 refills | Status: DC
Start: 2020-11-11 — End: 2021-04-27

## 2020-11-11 MED ORDER — ONDANSETRON 8 MG PO TBDP: 8.0000 mg | ORAL_TABLET | Freq: Three times a day (TID) | ORAL | 1 refills | Status: DC | PRN

## 2020-11-11 MED ORDER — HYDROCODONE-ACETAMINOPHEN 5-325 MG PO TABS: ORAL_TABLET | ORAL | 0 refills | Status: DC

## 2020-11-11 MED ORDER — OMEPRAZOLE 40 MG PO CPDR: 40.0000 mg | DELAYED_RELEASE_CAPSULE | Freq: Every day | ORAL | 3 refills | Status: DC

## 2020-11-11 NOTE — Progress Notes (Signed)
Pain contract/ UDS -  UTD - Jan 2022 Needs refills
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
## 2020-11-22 ENCOUNTER — Other Ambulatory Visit: Payer: Self-pay | Admitting: Physician Assistant

## 2020-11-22 ENCOUNTER — Encounter: Payer: Self-pay | Admitting: Physician Assistant

## 2020-11-22 DIAGNOSIS — E538 Deficiency of other specified B group vitamins: Secondary | ICD-10-CM

## 2020-11-23 MED ORDER — "SYRINGE 25G X 1-1/2"" 3 ML MISC": 99 refills | Status: DC

## 2020-11-23 MED ORDER — "NEEDLE (DISP) 18G X 1"" MISC": 99 refills | Status: DC

## 2020-11-23 MED ORDER — CYANOCOBALAMIN 1000 MCG/ML IJ SOLN: 1000.0000 ug | INTRAMUSCULAR | 1 refills | Status: DC

## 2020-11-23 NOTE — Telephone Encounter (Signed)
Will need labs for refill over 1 year. Has he been taking regularly?

## 2020-11-23 NOTE — Telephone Encounter (Signed)
Last written one year ago for 3 ml with one refill. Has not had labs. Please advise.

## 2021-02-15 ENCOUNTER — Encounter: Payer: Self-pay | Admitting: Physician Assistant

## 2021-02-16 MED ORDER — CLOBETASOL PROPIONATE 0.05 % EX CREA: 1.0000 "application " | TOPICAL_CREAM | Freq: Two times a day (BID) | CUTANEOUS | 0 refills | Status: DC

## 2021-02-22 ENCOUNTER — Encounter: Payer: Self-pay | Admitting: Physician Assistant

## 2021-02-22 ENCOUNTER — Telehealth (INDEPENDENT_AMBULATORY_CARE_PROVIDER_SITE_OTHER): Payer: BLUE CROSS/BLUE SHIELD | Admitting: Physician Assistant

## 2021-02-22 DIAGNOSIS — S32810D Multiple fractures of pelvis with stable disruption of pelvic ring, subsequent encounter for fracture with routine healing: Secondary | ICD-10-CM | POA: Diagnosis not present

## 2021-02-22 DIAGNOSIS — M5136 Other intervertebral disc degeneration, lumbar region: Secondary | ICD-10-CM | POA: Diagnosis not present

## 2021-02-22 DIAGNOSIS — G894 Chronic pain syndrome: Secondary | ICD-10-CM | POA: Diagnosis not present

## 2021-02-22 DIAGNOSIS — S32810S Multiple fractures of pelvis with stable disruption of pelvic ring, sequela: Secondary | ICD-10-CM

## 2021-02-22 MED ORDER — HYDROCODONE-ACETAMINOPHEN 5-325 MG PO TABS: ORAL_TABLET | ORAL | 0 refills | Status: DC

## 2021-02-22 NOTE — Progress Notes (Signed)
Pain contact and UDS UTD - Jan 2022
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
## 2021-02-23 ENCOUNTER — Encounter: Payer: Self-pay | Admitting: Physician Assistant

## 2021-04-23 DIAGNOSIS — L03011 Cellulitis of right finger: Secondary | ICD-10-CM | POA: Diagnosis not present

## 2021-04-27 ENCOUNTER — Telehealth (INDEPENDENT_AMBULATORY_CARE_PROVIDER_SITE_OTHER): Payer: BLUE CROSS/BLUE SHIELD | Admitting: Physician Assistant

## 2021-04-27 ENCOUNTER — Encounter: Payer: Self-pay | Admitting: Physician Assistant

## 2021-04-27 DIAGNOSIS — K2100 Gastro-esophageal reflux disease with esophagitis, without bleeding: Secondary | ICD-10-CM

## 2021-04-27 DIAGNOSIS — R110 Nausea: Secondary | ICD-10-CM

## 2021-04-27 DIAGNOSIS — H6981 Other specified disorders of Eustachian tube, right ear: Secondary | ICD-10-CM

## 2021-04-27 DIAGNOSIS — G894 Chronic pain syndrome: Secondary | ICD-10-CM

## 2021-04-27 DIAGNOSIS — M5136 Other intervertebral disc degeneration, lumbar region: Secondary | ICD-10-CM | POA: Diagnosis not present

## 2021-04-27 DIAGNOSIS — F411 Generalized anxiety disorder: Secondary | ICD-10-CM

## 2021-04-27 MED ORDER — HYDROCODONE-ACETAMINOPHEN 5-325 MG PO TABS: ORAL_TABLET | ORAL | 0 refills | Status: DC

## 2021-04-27 MED ORDER — ONDANSETRON 8 MG PO TBDP: 8.0000 mg | ORAL_TABLET | Freq: Three times a day (TID) | ORAL | 1 refills | Status: DC | PRN

## 2021-04-27 MED ORDER — ALPRAZOLAM 1 MG PO TABS: ORAL_TABLET | ORAL | 5 refills | Status: DC

## 2021-04-27 MED ORDER — OMEPRAZOLE 40 MG PO CPDR: 40.0000 mg | DELAYED_RELEASE_CAPSULE | Freq: Every day | ORAL | 3 refills | Status: DC

## 2021-04-27 MED ORDER — LEVOCETIRIZINE DIHYDROCHLORIDE 5 MG PO TABS: 5.0000 mg | ORAL_TABLET | Freq: Every evening | ORAL | 3 refills | Status: DC

## 2021-04-27 NOTE — Progress Notes (Signed)
..Virtual Visit via Telephone Note  I connected with Luke Villarreal on 04/27/21 at  1:20 PM EST by telephone and verified that I am speaking with the correct person using two identifiers.  Location: Patient: work Provider: clinic  .Marland KitchenParticipating in visit:  Patient: Luke Villarreal  Provider: Tandy Gaw PA-C   I discussed the limitations, risks, security and privacy concerns of performing an evaluation and management service by telephone and the availability of in person appointments. I also discussed with the patient that there may be a patient responsible charge related to this service. The patient expressed understanding and agreed to proceed.   History of Present Illness: Pt is a 45 yo male with Chronic pain syndrome after pelvic fractures, seasonal allergies, GERD, anxiety who calls in for refills.   Pain is controlled. No concerns.   Anxiety 1 xanax a day is controlled and does not take with norco.  GERD- no concerns on omeprazole.   Allergies no problems on xyzal.   .. Active Ambulatory Problems    Diagnosis Date Noted   Gastroesophageal reflux disease without esophagitis 09/21/2015   Rhinitis, allergic 09/21/2015   Panic attacks 09/21/2015   Seasonal allergies 09/21/2015   Hemorrhoid 09/21/2015   History of asthma 09/21/2015   Pelvic pain 11/25/2015   Bilateral carpal tunnel syndrome 11/25/2015   Hematuria 11/25/2015   Hypertriglyceridemia 11/26/2015   Low HDL (under 40) 11/26/2015   Anterolisthesis 11/26/2015   Male hypogonadism 03/08/2016   B12 deficiency 09/20/2016   Anxiety state 09/20/2016   Closed pelvic ring fracture (HCC) 03/20/2018   Sacral fracture, closed (HCC) 03/20/2018   Pelvic hematoma, male 03/20/2018   Lumbar degenerative disc disease 06/20/2018   Allergic conjunctivitis of both eyes 06/20/2018   Chronic allergic rhinitis due to pollen 06/20/2018   Chronic pain syndrome 08/18/2018   Right ankle swelling 11/15/2019   Muscle cramps 11/15/2019    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 There is no height or weight on file to calculate BMI.    Assessment and Plan: Marland KitchenMarland KitchenOrdell was seen today for pain.  Diagnoses and all orders for this visit:  Chronic pain syndrome -     HYDROcodone-acetaminophen (NORCO/VICODIN) 5-325 MG tablet; Take one tablet as needed for moderate to severe pain. -     HYDROcodone-acetaminophen (NORCO/VICODIN) 5-325 MG tablet; Take one tablet by mouth once a day. -     HYDROcodone-acetaminophen (NORCO/VICODIN) 5-325 MG tablet; Take one tablet once daily as needed for pain.  Lumbar degenerative disc disease -     HYDROcodone-acetaminophen (NORCO/VICODIN) 5-325 MG tablet; Take one tablet as needed for moderate to severe pain. -     HYDROcodone-acetaminophen (NORCO/VICODIN) 5-325 MG tablet; Take one tablet by mouth once a day. -     HYDROcodone-acetaminophen (NORCO/VICODIN) 5-325 MG tablet; Take one tablet once daily as needed for pain.  Anxiety state -     ALPRAZolam (XANAX) 1 MG tablet; TAKE 1 TABLET BY MOUTH AT BEDTIME, DO NOT TAKE WITH NORCO  Gastroesophageal reflux  disease with esophagitis, unspecified whether hemorrhage -     omeprazole (PRILOSEC) 40 MG capsule; Take 1 capsule (40 mg total) by mouth daily.  ETD (Eustachian tube dysfunction), right -     levocetirizine (XYZAL) 5 MG tablet; Take 1 tablet (5 mg total) by mouth every evening.  Nausea -     ondansetron (ZOFRAN-ODT) 8 MG disintegrating tablet; Take 1 tablet (8  mg total) by mouth every 8 (eight) hours as needed. for nausea   Pain contract UTD. Needs in person appt to resign at next visit and do UDS.  Marland Kitchen.PDMP reviewed during this encounter. No concerns Follow up in 3 months.   Refilled zofran, omeprazole, xyzal, flexeril.    Follow Up Instructions:    I discussed the assessment and treatment plan with the patient. The patient was provided an opportunity to ask questions and all were answered. The patient agreed with the plan and demonstrated an understanding of the instructions.   The patient was advised to call back or seek an in-person evaluation if the symptoms worsen or if the condition fails to improve as anticipated.  I provided 12 minutes of non-face-to-face time during this encounter.   Iran Planas, PA-C

## 2021-06-08 ENCOUNTER — Encounter: Payer: Self-pay | Admitting: Physician Assistant

## 2021-06-09 MED ORDER — MONTELUKAST SODIUM 10 MG PO TABS: 10.0000 mg | ORAL_TABLET | Freq: Every day | ORAL | 3 refills | Status: DC

## 2021-06-09 MED ORDER — FLUTICASONE PROPIONATE 50 MCG/ACT NA SUSP: 2.0000 | Freq: Every day | NASAL | 3 refills | Status: DC

## 2021-06-09 MED ORDER — METHYLPREDNISOLONE 4 MG PO TBPK: ORAL_TABLET | ORAL | 0 refills | Status: DC

## 2021-07-10 ENCOUNTER — Encounter: Payer: Self-pay | Admitting: Physician Assistant

## 2021-07-11 DIAGNOSIS — H5789 Other specified disorders of eye and adnexa: Secondary | ICD-10-CM | POA: Diagnosis not present

## 2021-07-11 DIAGNOSIS — T1592XA Foreign body on external eye, part unspecified, left eye, initial encounter: Secondary | ICD-10-CM | POA: Diagnosis not present

## 2021-07-12 ENCOUNTER — Telehealth (INDEPENDENT_AMBULATORY_CARE_PROVIDER_SITE_OTHER): Payer: Self-pay | Admitting: Physician Assistant

## 2021-07-12 ENCOUNTER — Encounter: Payer: Self-pay | Admitting: Physician Assistant

## 2021-07-12 VITALS — Ht 68.0 in | Wt 239.0 lb

## 2021-07-12 DIAGNOSIS — M5136 Other intervertebral disc degeneration, lumbar region: Secondary | ICD-10-CM

## 2021-07-12 DIAGNOSIS — R102 Pelvic and perineal pain unspecified side: Secondary | ICD-10-CM

## 2021-07-12 DIAGNOSIS — Z13220 Encounter for screening for lipoid disorders: Secondary | ICD-10-CM

## 2021-07-12 DIAGNOSIS — M51369 Other intervertebral disc degeneration, lumbar region without mention of lumbar back pain or lower extremity pain: Secondary | ICD-10-CM

## 2021-07-12 DIAGNOSIS — Z1329 Encounter for screening for other suspected endocrine disorder: Secondary | ICD-10-CM

## 2021-07-12 DIAGNOSIS — E781 Pure hyperglyceridemia: Secondary | ICD-10-CM

## 2021-07-12 DIAGNOSIS — G894 Chronic pain syndrome: Secondary | ICD-10-CM

## 2021-07-12 DIAGNOSIS — Z79899 Other long term (current) drug therapy: Secondary | ICD-10-CM

## 2021-07-12 DIAGNOSIS — N501 Vascular disorders of male genital organs: Secondary | ICD-10-CM

## 2021-07-12 DIAGNOSIS — E538 Deficiency of other specified B group vitamins: Secondary | ICD-10-CM

## 2021-07-12 DIAGNOSIS — S32810S Multiple fractures of pelvis with stable disruption of pelvic ring, sequela: Secondary | ICD-10-CM

## 2021-07-12 DIAGNOSIS — Z1211 Encounter for screening for malignant neoplasm of colon: Secondary | ICD-10-CM

## 2021-07-12 DIAGNOSIS — Z131 Encounter for screening for diabetes mellitus: Secondary | ICD-10-CM

## 2021-07-12 DIAGNOSIS — S3210XS Unspecified fracture of sacrum, sequela: Secondary | ICD-10-CM

## 2021-07-12 MED ORDER — HYDROCODONE-ACETAMINOPHEN 5-325 MG PO TABS: ORAL_TABLET | ORAL | 0 refills | Status: DC

## 2021-07-12 MED ORDER — CYCLOBENZAPRINE HCL 10 MG PO TABS: ORAL_TABLET | ORAL | 5 refills | Status: DC

## 2021-07-12 NOTE — Progress Notes (Signed)
..Virtual Visit via Telephone Note ? ?I connected with Luke Villarreal on 07/12/21 at  9:30 AM EDT by telephone and verified that I am speaking with the correct person using two identifiers. ? ?Location: ?Patient: work ?Provider: clinic ? ?Marland Kitchen.Participating in visit:  ?Patient: Luke Villarreal ?Provider: Tandy Gaw PA-C ?Provider in training: Joyice Faster PA-S ? ?  ?I discussed the limitations, risks, security and privacy concerns of performing an evaluation and management service by telephone and the availability of in person appointments. I also discussed with the patient that there may be a patient responsible charge related to this service. The patient expressed understanding and agreed to proceed. ? ? ?History of Present Illness: ?Pt is a 46 yo male with chronic pain due to lumbar DDD and past fractures, seasonal allergies, anxiety GERD, B12 def who needs refills.  ? ?Pt does not take xanax and norco together.  ? ? ?.. ?Active Ambulatory Problems  ?  Diagnosis Date Noted  ? Gastroesophageal reflux disease without esophagitis 09/21/2015  ? Rhinitis, allergic 09/21/2015  ? Panic attacks 09/21/2015  ? Seasonal allergies 09/21/2015  ? Hemorrhoid 09/21/2015  ? History of asthma 09/21/2015  ? Pelvic pain 11/25/2015  ? Bilateral carpal tunnel syndrome 11/25/2015  ? Hematuria 11/25/2015  ? Hypertriglyceridemia 11/26/2015  ? Low HDL (under 40) 11/26/2015  ? Anterolisthesis 11/26/2015  ? 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 B12 deficiency 09/20/2016  ? Anxiety state 09/20/2016  ? Closed pelvic ring fracture (HCC) 03/20/2018  ? Sacral fracture, closed (HCC) 03/20/2018  ? Pelvic hematoma, male 03/20/2018  ? Lumbar degenerative disc disease 06/20/2018  ? Allergic conjunctivitis of both eyes 06/20/2018  ? Chronic allergic rhinitis due to pollen 06/20/2018  ? Chronic pain syndrome 08/18/2018  ? Right ankle swelling 11/15/2019  ? Muscle cramps 11/15/2019  ? 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 History of  DVT (deep vein thrombosis) 02/22/2020  ? Blood in stool 02/22/2020  ? Right leg pain 02/22/2020  ? Chronic pelvic pain in male 08/28/2020  ? Nausea 04/27/2021  ? ?Resolved Ambulatory Problems  ?  Diagnosis Date Noted  ? Gastroesophageal reflux disease with esophagitis 08/18/2018  ? ?No Additional Past Medical History  ? ? ? ?  ?Observations/Objective: ?No acute distress ?Normal mood ?Normal breathing ?Marland KitchenMarland Kitchen ?Depression screen [facility] 2/9 11/15/2019 05/07/2019 04/12/2017  ?Decreased Interest 0 1 0  ?Down, Depressed, Hopeless 0 0 0  ?PHQ - 2 Score 0 1 0  ?Altered sleeping 0 1 0  ?Tired, decreased energy 0 1 -  ?Change in appetite 0 0 2  ?Feeling bad or failure about yourself  0 0 0  ?Trouble concentrating 0 1 0  ?Moving slowly or fidgety/restless 0 1 0  ?Suicidal thoughts 0 0 0  ?PHQ-9 Score 0 5 2  ?Difficult doing work/chores - Not difficult at all -  ? ? ? ?Assessment and Plan: ?..Diagnoses and all orders for this visit: ? ?Chronic pain syndrome ?-     DRUG MONITORING, PANEL 6 WITH CONFIRMATION, URINE ?-     HYDROcodone-acetaminophen (NORCO/VICODIN) 5-325 MG tablet; Take one tablet by mouth once a day. ?-     HYDROcodone-acetaminophen (NORCO/VICODIN) 5-325 MG tablet; Take one tablet as needed for moderate to severe pain. ?-     HYDROcodone-acetaminophen (NORCO/VICODIN) 5-325 MG tablet; Take one tablet once daily as needed for pain. ? ?Medication management ?-     TSH ?-     Lipid Panel w/reflex Direct LDL ?-     COMPLETE  METABOLIC PANEL WITH GFR ?-     CBC with Differential/Platelet ?-     DRUG MONITORING, PANEL 6 WITH CONFIRMATION, URINE ?-     Vitamin B12 ? ?B12 deficiency ?-     Vitamin B12 ? ?Screening for lipid disorders ?-     Lipid Panel w/reflex Direct LDL ? ?Hypertriglyceridemia ?-     Lipid Panel w/reflex Direct LDL ? ?Thyroid disorder screen ?-     TSH ? ?Diabetes mellitus screening ?-     COMPLETE METABOLIC PANEL WITH GFR ? ?Pelvic pain ?-     cyclobenzaprine (FLEXERIL) 10 MG tablet; TAKE 1 TABLET 3 TIMES DAILY IF  NEEDED FOR MUSCLE SPASMS ? ?Closed pelvic ring fracture, sequela ?-     cyclobenzaprine (FLEXERIL) 10 MG tablet; TAKE 1 TABLET 3 TIMES DAILY IF NEEDED FOR MUSCLE SPASMS ? ?Closed fracture of sacrum, unspecified portion of sacrum, sequela ?-     cyclobenzaprine (FLEXERIL) 10 MG tablet; TAKE 1 TABLET 3 TIMES DAILY IF NEEDED FOR MUSCLE SPASMS ? ?Pelvic hematoma, male ?-     cyclobenzaprine (FLEXERIL) 10 MG tablet; TAKE 1 TABLET 3 TIMES DAILY IF NEEDED FOR MUSCLE SPASMS ? ?Lumbar degenerative disc disease ?-     HYDROcodone-acetaminophen (NORCO/VICODIN) 5-325 MG tablet; Take one tablet by mouth once a day. ?-     HYDROcodone-acetaminophen (NORCO/VICODIN) 5-325 MG tablet; Take one tablet as needed for moderate to severe pain. ?-     HYDROcodone-acetaminophen (NORCO/VICODIN) 5-325 MG tablet; Take one tablet once daily as needed for pain. ? ?Colon cancer screening ?-     Ambulatory referral to Gastroenterology ? ? ?Pt is on pain contract.  ?Needs new contract.printed today.  ?Pt does not take xanax and norco together and aware of risk of sudden death.  ?..PDMP reviewed during this encounter. ?[place] law requires 3 month follow up.  ? ?Needs colonoscopy. Needs fasting labs.  ? ? ?Follow Up Instructions: ? ?  ?I discussed the assessment and treatment plan with the patient. The patient was provided an opportunity to ask questions and all were answered. The patient agreed with the plan and demonstrated an understanding of the instructions. ?  ?The patient was advised to call back or seek an in-person evaluation if the symptoms worsen or if the condition fails to improve as anticipated. ? ?I provided 10 minutes of non-face-to-face time during this encounter. ? ? ?Tandy Gaw, PA-C ? ?

## 2021-07-12 NOTE — Telephone Encounter (Signed)
Noted on appt screen. ?

## 2021-07-12 NOTE — Progress Notes (Signed)
Pt would  like  A refill on all medications except singulair  ?

## 2021-07-26 ENCOUNTER — Encounter: Payer: Self-pay | Admitting: Physician Assistant

## 2021-07-27 ENCOUNTER — Encounter: Payer: Self-pay | Admitting: Neurology

## 2021-07-27 MED ORDER — PANTOPRAZOLE SODIUM 40 MG PO TBEC: 40.0000 mg | DELAYED_RELEASE_TABLET | Freq: Every day | ORAL | 3 refills | Status: DC

## 2021-07-28 DIAGNOSIS — Z87891 Personal history of nicotine dependence: Secondary | ICD-10-CM | POA: Diagnosis not present

## 2021-07-28 DIAGNOSIS — S0501XA Injury of conjunctiva and corneal abrasion without foreign body, right eye, initial encounter: Secondary | ICD-10-CM | POA: Diagnosis not present

## 2021-07-28 DIAGNOSIS — R519 Headache, unspecified: Secondary | ICD-10-CM | POA: Diagnosis not present

## 2021-07-28 DIAGNOSIS — B958 Unspecified staphylococcus as the cause of diseases classified elsewhere: Secondary | ICD-10-CM | POA: Diagnosis not present

## 2021-07-28 DIAGNOSIS — H538 Other visual disturbances: Secondary | ICD-10-CM | POA: Diagnosis not present

## 2021-07-28 DIAGNOSIS — X58XXXA Exposure to other specified factors, initial encounter: Secondary | ICD-10-CM | POA: Diagnosis not present

## 2021-07-28 DIAGNOSIS — Y7711 Contact lens associated with adverse incidents: Secondary | ICD-10-CM | POA: Diagnosis not present

## 2021-07-28 DIAGNOSIS — H16041 Marginal corneal ulcer, right eye: Secondary | ICD-10-CM | POA: Diagnosis not present

## 2021-07-28 DIAGNOSIS — H5789 Other specified disorders of eye and adnexa: Secondary | ICD-10-CM | POA: Diagnosis not present

## 2021-07-28 DIAGNOSIS — H53141 Visual discomfort, right eye: Secondary | ICD-10-CM | POA: Diagnosis not present

## 2021-07-28 DIAGNOSIS — Z888 Allergy status to other drugs, medicaments and biological substances status: Secondary | ICD-10-CM | POA: Diagnosis not present

## 2021-07-28 DIAGNOSIS — H5711 Ocular pain, right eye: Secondary | ICD-10-CM | POA: Diagnosis not present

## 2021-07-30 ENCOUNTER — Telehealth: Payer: Self-pay | Admitting: General Practice

## 2021-07-30 NOTE — Telephone Encounter (Signed)
Transition Care Management Unsuccessful Follow-up Telephone Call ? ?Date of discharge and from where:  07/28/21 from [place] [facility] ? ?Attempts:  1st Attempt ? ?Reason for unsuccessful TCM follow-up call:  Left voice message ? ?  ?

## 2021-08-02 NOTE — Telephone Encounter (Signed)
Transition Care Management Unsuccessful Follow-up Telephone Call ? ?Date of discharge and from where:  07/28/21 from [facility] ? ?Attempts:  2nd Attempt ? ?Reason for unsuccessful TCM follow-up call:  No answer/busy ? ?  ?

## 2021-08-04 NOTE — Telephone Encounter (Signed)
Transition Care Management Unsuccessful Follow-up Telephone Call ? ?Date of discharge and from where:  07/28/21 from [facility] ? ?Attempts:  3rd Attempt ? ?Reason for unsuccessful TCM follow-up call:  No answer/busy ? ?  ?

## 2021-08-23 DIAGNOSIS — R0602 Shortness of breath: Secondary | ICD-10-CM | POA: Diagnosis not present

## 2021-08-23 DIAGNOSIS — R062 Wheezing: Secondary | ICD-10-CM | POA: Diagnosis not present

## 2021-08-23 DIAGNOSIS — J4521 Mild intermittent asthma with (acute) exacerbation: Secondary | ICD-10-CM | POA: Diagnosis not present

## 2021-08-23 DIAGNOSIS — R059 Cough, unspecified: Secondary | ICD-10-CM | POA: Diagnosis not present

## 2021-08-23 DIAGNOSIS — Z9109 Other allergy status, other than to drugs and biological substances: Secondary | ICD-10-CM | POA: Diagnosis not present

## 2021-09-16 ENCOUNTER — Telehealth (INDEPENDENT_AMBULATORY_CARE_PROVIDER_SITE_OTHER): Payer: BLUE CROSS/BLUE SHIELD | Admitting: Physician Assistant

## 2021-09-16 ENCOUNTER — Encounter: Payer: Self-pay | Admitting: Physician Assistant

## 2021-09-16 VITALS — BP 128/72

## 2021-09-16 DIAGNOSIS — M5136 Other intervertebral disc degeneration, lumbar region: Secondary | ICD-10-CM | POA: Diagnosis not present

## 2021-09-16 DIAGNOSIS — G894 Chronic pain syndrome: Secondary | ICD-10-CM | POA: Diagnosis not present

## 2021-09-16 DIAGNOSIS — K649 Unspecified hemorrhoids: Secondary | ICD-10-CM

## 2021-09-16 DIAGNOSIS — F411 Generalized anxiety disorder: Secondary | ICD-10-CM

## 2021-09-16 DIAGNOSIS — Z1211 Encounter for screening for malignant neoplasm of colon: Secondary | ICD-10-CM

## 2021-09-16 MED ORDER — HYDROCORTISONE (PERIANAL) 2.5 % EX CREA: 1.0000 "application " | TOPICAL_CREAM | Freq: Two times a day (BID) | CUTANEOUS | 11 refills | Status: DC

## 2021-09-16 MED ORDER — HYDROCODONE-ACETAMINOPHEN 5-325 MG PO TABS: ORAL_TABLET | ORAL | 0 refills | Status: DC

## 2021-09-16 MED ORDER — ALPRAZOLAM 1 MG PO TABS: ORAL_TABLET | ORAL | 5 refills | Status: DC

## 2021-09-16 NOTE — Progress Notes (Signed)
..Virtual Visit via Telephone Note  I connected with Luke Villarreal on 09/16/21 at  2:40 PM EDT by telephone and verified that I am speaking with the correct person using two identifiers.  Location: Patient: home Provider: clinic  .Marland KitchenParticipating in visit:  Patient: [name] Provider: Tandy Gaw PA-C   I discussed the limitations, risks, security and privacy concerns of performing an evaluation and management service by telephone and the availability of in person appointments. I also discussed with the patient that there may be a patient responsible charge related to this service. The patient expressed understanding and agreed to proceed.   History of Present Illness: Pt is a 46 yo male who needs refills.   He uses xanax once a day and not with norco. He needs refills. He takes norco on average once a day. He is on pain contract. He needs hydrocortisone for hemorrhoids. He continues to have intermittent problems with them. He has had banding before which help but they came back.    .. Active Ambulatory Problems    Diagnosis Date Noted   Gastroesophageal reflux disease without esophagitis 09/21/2015   Rhinitis, allergic 09/21/2015   Panic attacks 09/21/2015   Seasonal allergies 09/21/2015   Hemorrhoid 09/21/2015   History of asthma 09/21/2015   Pelvic pain 11/25/2015   Bilateral carpal tunnel syndrome 11/25/2015   Hematuria 11/25/2015   Hypertriglyceridemia 11/26/2015   Low HDL (under 40) 11/26/2015   Anterolisthesis 11/26/2015   Male hypogonadism 03/08/2016   B12 deficiency 09/20/2016   Anxiety state 09/20/2016   Closed pelvic ring fracture (HCC) 03/20/2018   Sacral fracture, closed (HCC) 03/20/2018   Pelvic hematoma, male 03/20/2018   Lumbar degenerative disc disease 06/20/2018   Allergic conjunctivitis of both eyes 06/20/2018   Chronic allergic rhinitis due to pollen 06/20/2018   Chronic pain syndrome 08/18/2018   Right ankle swelling 11/15/2019   Muscle cramps  11/15/2019   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     Assessment and Plan: Marland KitchenMarland KitchenSadarius was seen today for pain.  Diagnoses and all orders for this visit:  Chronic pain syndrome -     HYDROcodone-acetaminophen (NORCO/VICODIN) 5-325 MG tablet; Take one tablet by mouth once a day. -     HYDROcodone-acetaminophen (NORCO/VICODIN) 5-325 MG tablet; Take one tablet as needed for moderate to severe pain. -     HYDROcodone-acetaminophen (NORCO/VICODIN) 5-325 MG tablet; Take one tablet once daily as needed for pain.  Anxiety state -     ALPRAZolam (XANAX) 1 MG tablet; TAKE 1 TABLET BY MOUTH AT BEDTIME, DO NOT TAKE WITH NORCO  Hemorrhoids, unspecified hemorrhoid type -     hydrocortisone (ANUSOL-HC) 2.5 % rectal cream; Place 1 application. rectally 2 (two) times daily.  Lumbar degenerative disc disease -     HYDROcodone-acetaminophen (NORCO/VICODIN) 5-325 MG tablet; Take one tablet by mouth once a day. -     HYDROcodone-acetaminophen (NORCO/VICODIN) 5-325 MG tablet; Take  one tablet as needed for moderate to severe pain. -     HYDROcodone-acetaminophen (NORCO/VICODIN) 5-325 MG tablet; Take one tablet once daily as needed for pain.  Colon cancer screening -     Ambulatory referral to Gastroenterology   .Marland KitchenPDMP reviewed during this encounter. Needs pain contract signed Needs UDS No concerns Refilled norco Needs follow up in 3 months IN PERSON  Hx  of hemorrhoids Refilled anusol Referral made for colonoscopy  Refilled xanax Pt does not take with norco Aware of risk of sudden death     Follow Up Instructions:    I discussed the assessment and treatment plan with the patient. The patient was provided an opportunity to ask questions and all were answered. The patient agreed with the plan and demonstrated an understanding of the instructions.   The patient was advised to call back or seek an in-person evaluation if the symptoms worsen or if the condition fails to improve as anticipated.  I provided 10 minutes of non-face-to-face time during this encounter.   Tandy Gaw, PA-C

## 2021-10-08 DIAGNOSIS — K219 Gastro-esophageal reflux disease without esophagitis: Secondary | ICD-10-CM | POA: Diagnosis not present

## 2021-10-08 DIAGNOSIS — K921 Melena: Secondary | ICD-10-CM | POA: Diagnosis not present

## 2021-10-08 DIAGNOSIS — K641 Second degree hemorrhoids: Secondary | ICD-10-CM | POA: Diagnosis not present

## 2021-10-08 DIAGNOSIS — K5904 Chronic idiopathic constipation: Secondary | ICD-10-CM | POA: Diagnosis not present

## 2021-10-27 ENCOUNTER — Encounter: Payer: Self-pay | Admitting: Physician Assistant

## 2021-10-27 MED ORDER — CLOTRIMAZOLE-BETAMETHASONE 1-0.05 % EX CREA: 1.0000 | TOPICAL_CREAM | Freq: Every day | CUTANEOUS | 0 refills | Status: DC

## 2021-12-25 ENCOUNTER — Other Ambulatory Visit: Payer: Self-pay | Admitting: Physician Assistant

## 2021-12-25 DIAGNOSIS — E538 Deficiency of other specified B group vitamins: Secondary | ICD-10-CM

## 2022-01-07 ENCOUNTER — Telehealth (INDEPENDENT_AMBULATORY_CARE_PROVIDER_SITE_OTHER): Payer: Self-pay | Admitting: Physician Assistant

## 2022-01-07 ENCOUNTER — Encounter: Payer: Self-pay | Admitting: Physician Assistant

## 2022-01-07 ENCOUNTER — Other Ambulatory Visit: Payer: Self-pay | Admitting: Physician Assistant

## 2022-01-07 DIAGNOSIS — E538 Deficiency of other specified B group vitamins: Secondary | ICD-10-CM

## 2022-01-07 DIAGNOSIS — G894 Chronic pain syndrome: Secondary | ICD-10-CM

## 2022-01-07 DIAGNOSIS — M5136 Other intervertebral disc degeneration, lumbar region: Secondary | ICD-10-CM

## 2022-01-07 MED ORDER — HYDROCODONE-ACETAMINOPHEN 5-325 MG PO TABS: ORAL_TABLET | ORAL | 0 refills | Status: DC

## 2022-01-07 MED ORDER — CYANOCOBALAMIN 1000 MCG/ML IJ SOLN: 1000.0000 ug | INTRAMUSCULAR | 0 refills | Status: DC

## 2022-01-07 NOTE — Progress Notes (Unsigned)
..  Virtual Visit via Telephone Note  I connected with Luke Villarreal on 01/10/22 at 11:10 AM EDT by telephone and verified that I am speaking with the correct person using two identifiers.  Location: Patient: home Provider: clinic  .Marland KitchenParticipating in visit:  Patient: Luke Villarreal Provider: Tandy Gaw PA-C Provider in training: Theo Dills PA-S   I discussed the limitations, risks, security and privacy concerns of performing an evaluation and management service by telephone and the availability of in person appointments. I also discussed with the patient that there may be a patient responsible charge related to this service. The patient expressed understanding and agreed to proceed.   History of Present Illness: Pt is a 46 yo male who needs refills on norco for chronic pain and b12 deficiency.   Pt is doing well with no complaints. He is taking norco once a day for pain. He is using b12 shots every 4 weeks. No concerns.     Observations/Objective: No acute distress   Assessment and Plan: Marland KitchenMarland KitchenDiagnoses and all orders for this visit:  Chronic pain syndrome -     HYDROcodone-acetaminophen (NORCO/VICODIN) 5-325 MG tablet; Take one tablet as needed for moderate to severe pain. -     HYDROcodone-acetaminophen (NORCO/VICODIN) 5-325 MG tablet; Take one tablet once daily as needed for pain. -     HYDROcodone-acetaminophen (NORCO/VICODIN) 5-325 MG tablet; Take one tablet by mouth once a day.  Lumbar degenerative disc disease -     HYDROcodone-acetaminophen (NORCO/VICODIN) 5-325 MG tablet; Take one tablet as needed for moderate to severe pain. -     HYDROcodone-acetaminophen (NORCO/VICODIN) 5-325 MG tablet; Take one tablet once daily as needed for pain. -     HYDROcodone-acetaminophen (NORCO/VICODIN) 5-325 MG tablet; Take one tablet by mouth once a day.  B12 deficiency -     cyanocobalamin (VITAMIN B12) 1000 MCG/ML injection; Inject 1 mL (1,000 mcg total) into the skin every 30 (thirty)  days.   NEEDS labs and in person visit.  Pt is aware. Marland KitchenMarland KitchenPDMP reviewed during this encounter. Refills sent for 3 months of norco and b12 shots. Follow up in 3 months.    Follow Up Instructions:    I discussed the assessment and treatment plan with the patient. The patient was provided an opportunity to ask questions and all were answered. The patient agreed with the plan and demonstrated an understanding of the instructions.   The patient was advised to call back or seek an in-person evaluation if the symptoms worsen or if the condition fails to improve as anticipated.  I provided 7 minutes of non-face-to-face time during this encounter.   Tandy Gaw, PA-C

## 2022-01-10 ENCOUNTER — Encounter: Payer: Self-pay | Admitting: Physician Assistant

## 2022-01-13 ENCOUNTER — Encounter: Payer: Self-pay | Admitting: Physician Assistant

## 2022-01-14 ENCOUNTER — Telehealth: Payer: Self-pay | Admitting: Physician Assistant

## 2022-01-14 MED ORDER — TERBINAFINE HCL 250 MG PO TABS: 250.0000 mg | ORAL_TABLET | Freq: Every day | ORAL | 0 refills | Status: DC

## 2022-01-14 MED ORDER — GRISEOFULVIN MICROSIZE 500 MG PO TABS: 500.0000 mg | ORAL_TABLET | Freq: Every day | ORAL | 0 refills | Status: DC

## 2022-01-14 NOTE — Telephone Encounter (Signed)
Angela submitted PA via Coverm[employer]eds.

## 2022-01-14 NOTE — Addendum Note (Signed)
Addended by: Jomarie Longs on: 01/14/2022 03:34 PM   Modules accepted: Orders

## 2022-01-14 NOTE — Telephone Encounter (Signed)
Spouse said med needs PA and can it be done today? She also has new insurance and has sent a copy.

## 2022-01-19 NOTE — Progress Notes (Deleted)
   Acute Office Visit  Subjective:     Patient ID: Luke Villarreal, male    DOB: 04/02/1976, 46 y.o.   MRN: [id number]  No chief complaint on file.   HPI Patient is in today for pruritic rash on forehead. He was seen at [facility] and diagnosed with tinea capitis.   Terbinafine     ROS      Objective:    There were no vitals taken for this visit. {Vitals History (Optional):23777}  Physical Exam  No results found for any visits on 01/20/22.      Assessment & Plan:   Problem List Items Addressed This Visit   None   No orders of the defined types were placed in this encounter.   No follow-ups on file.  Owens Loffler, DO

## 2022-01-20 ENCOUNTER — Ambulatory Visit: Payer: Self-pay | Admitting: Family Medicine

## 2022-01-31 ENCOUNTER — Ambulatory Visit: Payer: Self-pay | Admitting: Physician Assistant

## 2022-02-02 ENCOUNTER — Encounter: Payer: Self-pay | Admitting: Physician Assistant

## 2022-02-02 ENCOUNTER — Ambulatory Visit (INDEPENDENT_AMBULATORY_CARE_PROVIDER_SITE_OTHER): Payer: Self-pay | Admitting: Physician Assistant

## 2022-02-02 ENCOUNTER — Telehealth: Payer: Self-pay | Admitting: Physician Assistant

## 2022-02-02 VITALS — BP 151/92 | HR 97 | Ht 68.0 in | Wt 243.0 lb

## 2022-02-02 DIAGNOSIS — L439 Lichen planus, unspecified: Secondary | ICD-10-CM

## 2022-02-02 DIAGNOSIS — L720 Epidermal cyst: Secondary | ICD-10-CM

## 2022-02-02 DIAGNOSIS — G894 Chronic pain syndrome: Secondary | ICD-10-CM

## 2022-02-02 MED ORDER — PREDNISONE 50 MG PO TABS: ORAL_TABLET | ORAL | 0 refills | Status: DC

## 2022-02-02 NOTE — Patient Instructions (Signed)
Stop lamisil.  Prednisone and let me know what cream you have.   Lichen Planus Lichen planus is a skin problem that causes redness, itching, small bumps, and sores. It can affect the skin in any area of the body. Some common areas that are affected include: Arms, wrists, legs, or ankles. Chest, back, or abdomen. Genital areas. Gums and inside of the mouth. Scalp. Fingernails or toenails. Treatment can help control the symptoms of this condition. The condition can last for a long time. It can take 6-18 months or longer for it to go away. What are the causes? The exact cause of this condition is not known. The condition is not passed from one person to another (not contagious). It may be related to an allergy, a medicine, or an autoimmune response. An autoimmune response occurs when the body's defense system (immune system) mistakenly attacks healthy tissues. What increases the risk? The following factors may make you more likely to develop this condition: Being 35-64 years of age. Taking certain medicines. Having been exposed to certain dyes or chemicals. Having hepatitis C. What are the signs or symptoms? Symptoms of this condition may include: Itching, which can be severe. Small reddish or purple bumps on the skin. These may have flat tops and may be round or irregular shaped. Redness or white patches on the gums or tongue. Redness, soreness, or a burning feeling in the genital area. This may lead to pain or bleeding during sex. Changes in the fingernails or toenails. The nails may become thin or rough. They may have ridges in them. Redness or irritation of the eyes. This is rare. How is this diagnosed? This condition may be diagnosed based on: A physical exam. The health care provider will examine your affected skin and check for changes inside your mouth. Removal of a tissue sample to be looked at under a microscope (biopsy). How is this treated? Treatment for this condition may  depend on the severity of symptoms. In some cases, no treatment is needed. If treatment is needed to control symptoms, it may include: Creams or ointments (topical steroids) to help control itching and irritation. Medicine to be taken by mouth. Medicine to be taken by injection. A treatment in which your skin is exposed to ultraviolet light (phototherapy). Lozenges to suck on to help treat sores in the mouth. Follow these instructions at home:  Skin care Use creams or ointments as told by your health care provider. To soothe itching skin: Apply cool, wet cloths (cold compresses) to the affected area. Take cool baths. Add dry oatmeal to the water. Do not bathe in hot water. Do not scratch your skin. To help prevent scratching: Keep your fingernails short and clean. Cover the affected area with a bandage. Wash the affected area with mild soap and cool water as told by your health care provider. Keep the genital area as clean and dry as possible. General instructions Take or use over-the-counter and prescription medicines only as told by your health care provider. If you have sores in your mouth, avoid spicy and acidic foods as well as alcohol and tobacco. Keep all follow-up visits. Contact a health care provider if: You have increasing redness, swelling, or pain in the affected area. You have fluid, blood, or pus coming from the affected area. Your eyes become irritated. You have hair loss. Summary Lichen planus is a skin problem that causes redness, itching, small bumps, and sores. It can affect the skin in any area of the body. Do  not scratch your skin. Apply cool, wet cloths (cold compresses) on the affected areas or take cool baths to soothe itching. Take or use over-the-counter and prescription medicines only as told by your health care provider. Contact a health care provider if you have drainage from the affected area or have increasing redness, swelling, or pain in the  area. Keep all follow-up visits. This information is not intended to replace advice given to you by your health care provider. Make sure you discuss any questions you have with your health care provider. Document Revised: 06/16/2021 Document Reviewed: 06/16/2021 Elsevier Patient Education  2023 [employer].

## 2022-02-02 NOTE — Progress Notes (Unsigned)
Acute Office Visit  Subjective:     Patient ID: Luke Villarreal, male    DOB: [birth year], 46 y.o.   MRN: [id number]  Chief Complaint  Patient presents with   Rash    HPI Patient is in today to follow up on rash on scalp, forehead, neck for over a month now. He was first given a topical antifungal cream then sent oral lamasil. He has been taking oral for 1 month with no improvement. Rash is very itchy and worsens with sweating. No fever, chills, body aches.   He has a white bump at the end of his left nipple. No pain. Noticed for a few weeks. Not done anything to try to make better.   Does not need norco right now but needed to come in to sign pain contract.   .. Active Ambulatory Problems    Diagnosis Date Noted   Gastroesophageal reflux disease without esophagitis 09/21/2015   Rhinitis, allergic 09/21/2015   Panic attacks 09/21/2015   Seasonal allergies 09/21/2015   Hemorrhoid 09/21/2015   History of asthma 09/21/2015   Pelvic pain 11/25/2015   Bilateral carpal tunnel syndrome 11/25/2015   Hematuria 11/25/2015   Hypertriglyceridemia 11/26/2015   Low HDL (under 40) 11/26/2015   Anterolisthesis 11/26/2015   Male hypogonadism 03/08/2016   B12 deficiency 09/20/2016   Anxiety state 09/20/2016   Closed pelvic ring fracture ([place]) 03/20/2018   Sacral fracture, closed ([place]) 03/20/2018   Pelvic hematoma, male 03/20/2018   Lumbar degenerative disc disease 06/20/2018   Allergic conjunctivitis of both eyes 06/20/2018   Chronic allergic rhinitis due to pollen 06/20/2018   Chronic pain syndrome 08/18/2018   Right ankle swelling 11/15/2019   Muscle cramps 11/15/2019   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     Objective:    BP (!) 151/92   Pulse 97   Ht 5\' 8"  (1.727 m)   Wt 243 lb (110.2 kg)   SpO2 98%   BMI 36.95 kg/m  BP Readings from Last 3 Encounters:  02/02/22 (!) 151/92  09/16/21 128/72  05/20/20 131/85   Wt Readings from Last 3 Encounters:  02/02/22 243 lb (110.2 kg)  07/12/21 239 lb (108.4 kg)  11/11/20 238 lb (108 kg)      Physical Exam RASH: neck/forehead/face raised erythematous/purplish papules flat without scales but appears almost glossy.   29mm hard white milia of nipple   Nipple cleaned with alcohol and incision made over the top and expressed thick white contents       Assessment & Plan:  Marland KitchenMarland KitchenGardiner was seen today for rash.  Diagnoses and all orders for this visit:  Lichen planus -     predniSONE (DELTASONE) 50 MG tablet; One tab PO daily for 5 days.  Milia  Chronic pain syndrome   I think the rash could be lichen planus based on itchiness and appearance and the fact that he has been on lamisil for one month with no improvement Sent prednisone burst and clobetosol cream Referral to dermatology  Milia removed  Pain contract signed today  Tandy Gaw, PA-C

## 2022-02-03 ENCOUNTER — Encounter: Payer: Self-pay | Admitting: Physician Assistant

## 2022-02-03 DIAGNOSIS — L720 Epidermal cyst: Secondary | ICD-10-CM | POA: Insufficient documentation

## 2022-02-03 DIAGNOSIS — L439 Lichen planus, unspecified: Secondary | ICD-10-CM | POA: Insufficient documentation

## 2022-02-03 MED ORDER — CLOBETASOL PROPIONATE 0.05 % EX CREA: 1.0000 | TOPICAL_CREAM | Freq: Two times a day (BID) | CUTANEOUS | 0 refills | Status: DC

## 2022-03-14 ENCOUNTER — Encounter: Payer: Self-pay | Admitting: Physician Assistant

## 2022-03-14 DIAGNOSIS — F411 Generalized anxiety disorder: Secondary | ICD-10-CM

## 2022-03-15 MED ORDER — ALPRAZOLAM 1 MG PO TABS: ORAL_TABLET | ORAL | 5 refills | Status: DC

## 2022-03-15 NOTE — Telephone Encounter (Signed)
Xanax last written 09/16/2021 #30 with 5 refills  Last appt 02/02/2022

## 2022-03-16 ENCOUNTER — Telehealth (INDEPENDENT_AMBULATORY_CARE_PROVIDER_SITE_OTHER): Payer: Self-pay | Admitting: Physician Assistant

## 2022-03-16 ENCOUNTER — Encounter: Payer: Self-pay | Admitting: Physician Assistant

## 2022-03-16 DIAGNOSIS — M5136 Other intervertebral disc degeneration, lumbar region: Secondary | ICD-10-CM

## 2022-03-16 DIAGNOSIS — G894 Chronic pain syndrome: Secondary | ICD-10-CM

## 2022-03-16 NOTE — Telephone Encounter (Signed)
Patient already schdl for visit on Friday 03/18/22 at 2pm

## 2022-03-16 NOTE — Progress Notes (Signed)
Called and ranged but had no room to leave voice message. 1:26  .Marland KitchenMarland KitchenPDMP reviewed during this encounter. Pt really does not need appt until 12/8

## 2022-03-16 NOTE — Progress Notes (Signed)
Pain contract UTD (March)  Xanax just sent Pain meds aren't due until December 7, pended next two months

## 2022-03-18 ENCOUNTER — Encounter: Payer: Self-pay | Admitting: Physician Assistant

## 2022-03-18 ENCOUNTER — Telehealth (INDEPENDENT_AMBULATORY_CARE_PROVIDER_SITE_OTHER): Payer: PRIVATE HEALTH INSURANCE | Admitting: Physician Assistant

## 2022-03-18 DIAGNOSIS — G894 Chronic pain syndrome: Secondary | ICD-10-CM

## 2022-03-18 DIAGNOSIS — L439 Lichen planus, unspecified: Secondary | ICD-10-CM | POA: Diagnosis not present

## 2022-03-18 DIAGNOSIS — M5136 Other intervertebral disc degeneration, lumbar region: Secondary | ICD-10-CM

## 2022-03-18 MED ORDER — HYDROCODONE-ACETAMINOPHEN 5-325 MG PO TABS: ORAL_TABLET | ORAL | 0 refills | Status: DC

## 2022-03-18 MED ORDER — TRIAMCINOLONE ACETONIDE 0.1 % EX CREA: 1.0000 | TOPICAL_CREAM | Freq: Two times a day (BID) | CUTANEOUS | 1 refills | Status: DC

## 2022-03-18 NOTE — Progress Notes (Signed)
Patient ID: Luke Villarreal, male   DOB: Jun 08, 1975, 46 y.o.   MRN: [id number] .Marland KitchenVirtual Visit via Telephone Note  I connected with Oluwaferanmi Wain on 03/18/22 at  2:00 PM EST by telephone and verified that I am speaking with the correct person using two identifiers.  Location: Patient: home Provider: clinic  .Marland KitchenParticipating in visit:  Patient: [name] Provider: Tandy Gaw PA-C    I discussed the limitations, risks, security and privacy concerns of performing an evaluation and management service by telephone and the availability of in person appointments. I also discussed with the patient that there may be a patient responsible charge related to this service. The patient expressed understanding and agreed to proceed.   History of Present Illness: Pt is a 46 yo male with chronic pain and anxiety who needs refills.   He is doing well with no concerns or complaints.   His rash on head and face has cleared up after steriods and using triamcinolone cream as needed. Would like refills on medication.    .. Active Ambulatory Problems    Diagnosis Date Noted   Gastroesophageal reflux disease without esophagitis 09/21/2015   Rhinitis, allergic 09/21/2015   Panic attacks 09/21/2015   Seasonal allergies 09/21/2015   Hemorrhoid 09/21/2015   History of asthma 09/21/2015   Pelvic pain 11/25/2015   Bilateral carpal tunnel syndrome 11/25/2015   Hematuria 11/25/2015   Hypertriglyceridemia 11/26/2015   Low HDL (under 40) 11/26/2015   Anterolisthesis 11/26/2015   Male hypogonadism 03/08/2016   B12 deficiency 09/20/2016   Anxiety state 09/20/2016   Closed pelvic ring fracture (HCC) 03/20/2018   Sacral fracture, closed (HCC) 03/20/2018   Pelvic hematoma, male 03/20/2018   Lumbar degenerative disc disease 06/20/2018   Allergic conjunctivitis of both eyes 06/20/2018   Chronic allergic rhinitis due to pollen 06/20/2018   Chronic pain syndrome 08/18/2018   Right ankle swelling 11/15/2019   Muscle  cramps 11/15/2019   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) 02/22/2020   History of DVT (deep vein thrombosis) 02/22/2020   Blood in stool 02/22/2020   Right leg pain 02/22/2020   Chronic pelvic pain in male 08/28/2020   Nausea 04/27/2021   Lichen planus 02/03/2022   Milia 02/03/2022   Resolved Ambulatory Problems    Diagnosis Date Noted   Gastroesophageal reflux disease with esophagitis 08/18/2018   No Additional Past Medical History    Observations/Objective: No acute distress Normal breathing Normal mood   Assessment and Plan: Marland KitchenMarland KitchenDiagnoses and all orders for this visit:  Chronic pain syndrome -     HYDROcodone-acetaminophen (NORCO/VICODIN) 5-325 MG tablet; Take one tablet once daily as needed for pain. -     HYDROcodone-acetaminophen (NORCO/VICODIN) 5-325 MG tablet; Take one tablet by mouth once a day. -     HYDROcodone-acetaminophen (NORCO/VICODIN) 5-325 MG tablet; Take one tablet as needed for moderate to severe pain.  Lumbar degenerative disc disease -     HYDROcodone-acetaminophen (NORCO/VICODIN) 5-325 MG tablet; Take one tablet once daily as needed for pain. -     HYDROcodone-acetaminophen (NORCO/VICODIN) 5-325 MG tablet; Take one tablet by mouth once a day. -     HYDROcodone-acetaminophen (NORCO/VICODIN) 5-325 MG tablet; Take one tablet as needed for moderate to severe pain.  Lichen planus -     triamcinolone cream (KENALOG) 0.1 %; Apply 1 Application topically 2 (two) times daily.   Pt doing well on norco ..PDMP reviewed during this encounter. Last refill 03/09/2022 Posted 3 months of prescriptions UTD pain contract  and UDS Follow up in 3 month via  controlled substance laws  Triamcinolone to use as needed for rash Instructed to not use to frequently   Follow Up Instructions:    I discussed the assessment and treatment plan with the patient. The patient was provided an opportunity to ask questions and all were answered. The patient  agreed with the plan and demonstrated an understanding of the instructions.   The patient was advised to call back or seek an in-person evaluation if the symptoms worsen or if the condition fails to improve as anticipated.  I provided 5 minutes of non-face-to-face time during this encounter.   Tandy Gaw, PA-C

## 2022-04-13 ENCOUNTER — Encounter: Payer: Self-pay | Admitting: Physician Assistant

## 2022-04-15 MED ORDER — PREDNISONE 50 MG PO TABS: 50.0000 mg | ORAL_TABLET | Freq: Every day | ORAL | 0 refills | Status: DC

## 2022-05-23 ENCOUNTER — Ambulatory Visit
Admission: RE | Admit: 2022-05-23 | Discharge: 2022-05-23 | Disposition: A | Discharge status: Home / Self Care | Payer: BC Managed Care – PPO | Source: Ambulatory Visit | Attending: Physician Assistant | Admitting: Physician Assistant

## 2022-05-23 ENCOUNTER — Telehealth: Payer: Self-pay

## 2022-05-23 NOTE — Telephone Encounter (Signed)
Pt called to request to come in sooner due to possibly needing [place] to r/o DVT if provider felt necessary.

## 2022-05-24 ENCOUNTER — Encounter: Payer: Self-pay | Admitting: Physician Assistant

## 2022-05-24 ENCOUNTER — Ambulatory Visit (INDEPENDENT_AMBULATORY_CARE_PROVIDER_SITE_OTHER): Payer: BC Managed Care – PPO

## 2022-05-24 ENCOUNTER — Ambulatory Visit (INDEPENDENT_AMBULATORY_CARE_PROVIDER_SITE_OTHER): Payer: BC Managed Care – PPO | Admitting: Physician Assistant

## 2022-05-24 VITALS — BP 142/98 | HR 78 | Ht 68.0 in | Wt 246.0 lb

## 2022-05-24 DIAGNOSIS — M25561 Pain in right knee: Secondary | ICD-10-CM | POA: Diagnosis not present

## 2022-05-24 DIAGNOSIS — R030 Elevated blood-pressure reading, without diagnosis of hypertension: Secondary | ICD-10-CM | POA: Diagnosis not present

## 2022-05-24 DIAGNOSIS — Z79899 Other long term (current) drug therapy: Secondary | ICD-10-CM | POA: Diagnosis not present

## 2022-05-24 DIAGNOSIS — G894 Chronic pain syndrome: Secondary | ICD-10-CM | POA: Diagnosis not present

## 2022-05-24 DIAGNOSIS — Z13220 Encounter for screening for lipoid disorders: Secondary | ICD-10-CM | POA: Diagnosis not present

## 2022-05-24 DIAGNOSIS — E781 Pure hyperglyceridemia: Secondary | ICD-10-CM | POA: Diagnosis not present

## 2022-05-24 DIAGNOSIS — Z131 Encounter for screening for diabetes mellitus: Secondary | ICD-10-CM | POA: Diagnosis not present

## 2022-05-24 MED ORDER — MELOXICAM 15 MG PO TABS: 15.0000 mg | ORAL_TABLET | Freq: Every day | ORAL | 1 refills | Status: DC

## 2022-05-24 MED ORDER — DICLOFENAC SODIUM 1 % EX GEL: 4.0000 g | Freq: Four times a day (QID) | CUTANEOUS | 1 refills | Status: DC

## 2022-05-24 NOTE — Patient Instructions (Addendum)
Right knee xray today.  Wear brace Ice twice a day Start mobic and use voltaren gel Consider injection if still hurting in 2 weeks
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
## 2022-05-25 ENCOUNTER — Encounter: Payer: Self-pay | Admitting: Physician Assistant

## 2022-05-25 DIAGNOSIS — E7800 Pure hypercholesterolemia, unspecified: Secondary | ICD-10-CM | POA: Insufficient documentation

## 2022-05-25 MED ORDER — ATORVASTATIN CALCIUM 40 MG PO TABS: 40.0000 mg | ORAL_TABLET | Freq: Every day | ORAL | 3 refills | Status: DC

## 2022-05-25 NOTE — Progress Notes (Signed)
Brinson,   B12 looks good.  Kidney, liver, glucose looks good.  LDL, bad cholesterol, is elevated.   10 year risk is below 4.2. goal is to be under 130. I would consider starting a low dose statin do decrease CV risk. Thoughts?   Marland KitchenMarland KitchenThe 10-year ASCVD risk score (Arnett DK, et al., 2019) is: 4.2%   Values used to calculate the score:     Age: 47 years     Sex: Male     Is Non-Hispanic African American: No     Diabetic: No     Tobacco smoker: No     Systolic Blood Pressure: 758 mmHg     Is BP treated: No     HDL Cholesterol: 39 mg/dL     Total Cholesterol: 232 mg/dL

## 2022-05-25 NOTE — Progress Notes (Signed)
Sent lipitor 40mg  daily to pharmacy.

## 2022-05-25 NOTE — Addendum Note (Signed)
Addended by: Donella Stade on: 05/25/2022 02:45 PM   Modules accepted: Orders

## 2022-05-26 LAB — DRUG MONITORING, PANEL 6 WITH CONFIRMATION, URINE
6 Acetylmorphine: NEGATIVE ng/mL (ref <10)
Alcohol Metabolites: NEGATIVE ng/mL (ref <500)
Alphahydroxyalprazolam: 73 ng/mL — ABNORMAL HIGH (ref <25)
Alphahydroxymidazolam: NEGATIVE ng/mL (ref <50)
Alphahydroxytriazolam: NEGATIVE ng/mL (ref <50)
Aminoclonazepam: NEGATIVE ng/mL (ref <25)
Amphetamines: NEGATIVE ng/mL (ref <500)
Barbiturates: NEGATIVE ng/mL (ref <300)
Benzodiazepines: POSITIVE ng/mL — AB (ref <100)
Cocaine Metabolite: NEGATIVE ng/mL (ref <150)
Creatinine: 196.6 mg/dL (ref >=20.0)
Hydroxyethylflurazepam: NEGATIVE ng/mL (ref <50)
Lorazepam: NEGATIVE ng/mL (ref <50)
Marijuana Metabolite: NEGATIVE ng/mL (ref <20)
Methadone Metabolite: NEGATIVE ng/mL (ref <100)
Nordiazepam: NEGATIVE ng/mL (ref <50)
Opiates: NEGATIVE ng/mL (ref <100)
Oxazepam: NEGATIVE ng/mL (ref <50)
Oxidant: NEGATIVE ug/mL (ref <200)
Oxycodone: NEGATIVE ng/mL (ref <100)
Phencyclidine: NEGATIVE ng/mL (ref <25)
Temazepam: NEGATIVE ng/mL (ref <50)
pH: 5.2 (ref 4.5–9.0)

## 2022-05-26 LAB — COMPLETE METABOLIC PANEL WITH GFR
AG Ratio: 1.3 (calc) (ref 1.0–2.5)
ALT: 32 U/L (ref 9–46)
AST: 19 U/L (ref 10–40)
Albumin: 4.5 g/dL (ref 3.6–5.1)
Alkaline phosphatase (APISO): 51 U/L (ref 36–130)
BUN: 15 mg/dL (ref 7–25)
CO2: 29 mmol/L (ref 20–32)
Calcium: 9.7 mg/dL (ref 8.6–10.3)
Chloride: 99 mmol/L (ref 98–110)
Creat: 1.12 mg/dL (ref 0.60–1.29)
Globulin: 3.4 g/dL (calc) (ref 1.9–3.7)
Glucose, Bld: 98 mg/dL (ref 65–99)
Potassium: 4.3 mmol/L (ref 3.5–5.3)
Sodium: 139 mmol/L (ref 135–146)
Total Bilirubin: 1 mg/dL (ref 0.2–1.2)
Total Protein: 7.9 g/dL (ref 6.1–8.1)
eGFR: 83 mL/min/{1.73_m2} (ref >=60)

## 2022-05-26 LAB — VITAMIN B12: Vitamin B-12: 540 pg/mL (ref 200–1100)

## 2022-05-26 LAB — CBC WITH DIFFERENTIAL/PLATELET
Absolute Monocytes: 678 cells/uL (ref 200–950)
Basophils Absolute: 19 cells/uL (ref 0–200)
Basophils Relative: 0.3 %
Eosinophils Absolute: 102 cells/uL (ref 15–500)
Eosinophils Relative: 1.6 %
HCT: 46.8 % (ref 38.5–50.0)
Hemoglobin: 15.7 g/dL (ref 13.2–17.1)
Lymphs Abs: 1434 cells/uL (ref 850–3900)
MCH: 29.5 pg (ref 27.0–33.0)
MCHC: 33.5 g/dL (ref 32.0–36.0)
MCV: 88 fL (ref 80.0–100.0)
MPV: 10.2 fL (ref 7.5–12.5)
Monocytes Relative: 10.6 %
Neutro Abs: 4166 cells/uL (ref 1500–7800)
Neutrophils Relative %: 65.1 %
Platelets: 238 10*3/uL (ref 140–400)
RBC: 5.32 10*6/uL (ref 4.20–5.80)
RDW: 12.3 % (ref 11.0–15.0)
Total Lymphocyte: 22.4 %
WBC: 6.4 10*3/uL (ref 3.8–10.8)

## 2022-05-26 LAB — DM TEMPLATE

## 2022-05-26 LAB — LIPID PANEL W/REFLEX DIRECT LDL
Cholesterol: 232 mg/dL — ABNORMAL HIGH (ref <200)
HDL: 39 mg/dL — ABNORMAL LOW (ref >=40)
LDL Cholesterol (Calc): 147 mg/dL (calc) — ABNORMAL HIGH
Non-HDL Cholesterol (Calc): 193 mg/dL (calc) — ABNORMAL HIGH (ref <130)
Total CHOL/HDL Ratio: 5.9 (calc) — ABNORMAL HIGH (ref <5.0)
Triglycerides: 283 mg/dL — ABNORMAL HIGH (ref <150)

## 2022-05-26 LAB — TSH: TSH: 2.89 mIU/L (ref 0.40–4.50)

## 2022-05-27 ENCOUNTER — Encounter: Payer: Self-pay | Admitting: Physician Assistant

## 2022-05-27 NOTE — Progress Notes (Signed)
Confirm last time before labs drawn he had the norco?

## 2022-05-27 NOTE — Progress Notes (Signed)
Inflammatory changes of the kneecap causing some patellofemoral syndrome. How is knee today? You could transition to a patellofemoral strap.

## 2022-06-07 ENCOUNTER — Encounter: Payer: Self-pay | Admitting: Physician Assistant

## 2022-06-08 MED ORDER — PREDNISONE 50 MG PO TABS: ORAL_TABLET | ORAL | 0 refills | Status: DC

## 2022-06-16 ENCOUNTER — Telehealth: Payer: Self-pay

## 2022-06-16 ENCOUNTER — Other Ambulatory Visit: Payer: Self-pay | Admitting: Physician Assistant

## 2022-06-16 ENCOUNTER — Encounter: Payer: Self-pay | Admitting: Physician Assistant

## 2022-06-16 DIAGNOSIS — G894 Chronic pain syndrome: Secondary | ICD-10-CM

## 2022-06-16 DIAGNOSIS — M5136 Other intervertebral disc degeneration, lumbar region: Secondary | ICD-10-CM

## 2022-06-16 NOTE — Telephone Encounter (Addendum)
Initiated Prior authorization [id number] 5-'325MG'$  tablets Via: Covermymeds Case/Key:[id number] Status: approved as of 06/16/22 Reason:Effective from 06/16/2022 through 07/15/2022. Notified Pt via: Mychart

## 2022-06-17 MED ORDER — HYDROCODONE-ACETAMINOPHEN 5-325 MG PO TABS: ORAL_TABLET | ORAL | 0 refills | Status: DC

## 2022-06-17 MED ORDER — FLUTICASONE PROPIONATE 50 MCG/ACT NA SUSP: 2.0000 | Freq: Every day | NASAL | 3 refills | Status: DC

## 2022-06-17 NOTE — Telephone Encounter (Signed)
Patient wanting to use last appt for pain follow up. He received RX on 06/05/2022 and wants March and April sent as well. Pended prescriptions, please sign.

## 2022-06-23 ENCOUNTER — Encounter: Payer: Self-pay | Admitting: Physician Assistant

## 2022-06-23 DIAGNOSIS — L853 Xerosis cutis: Secondary | ICD-10-CM | POA: Diagnosis not present

## 2022-06-23 DIAGNOSIS — H6991 Unspecified Eustachian tube disorder, right ear: Secondary | ICD-10-CM

## 2022-06-23 DIAGNOSIS — L209 Atopic dermatitis, unspecified: Secondary | ICD-10-CM | POA: Diagnosis not present

## 2022-06-23 DIAGNOSIS — L299 Pruritus, unspecified: Secondary | ICD-10-CM | POA: Diagnosis not present

## 2022-06-24 ENCOUNTER — Encounter: Payer: Self-pay | Admitting: Physician Assistant

## 2022-06-24 ENCOUNTER — Telehealth: Payer: Self-pay

## 2022-06-24 MED ORDER — LEVOCETIRIZINE DIHYDROCHLORIDE 5 MG PO TABS: 5.0000 mg | ORAL_TABLET | Freq: Every evening | ORAL | 3 refills | Status: DC

## 2022-06-24 MED ORDER — MONTELUKAST SODIUM 10 MG PO TABS: 10.0000 mg | ORAL_TABLET | Freq: Every day | ORAL | 3 refills | Status: DC

## 2022-06-24 NOTE — Telephone Encounter (Signed)
Reqeusting rx rf of  Montelukast - does not show in patient med list  Xyzal last written over a year ago - 04/27/2021 Last OV  05/24/2022 No upcoming appointment scheduled.

## 2022-06-24 NOTE — Telephone Encounter (Signed)
Initiated Prior authorization [id number] Sodium '40MG'$  dr tablets Via: Covermymeds Case/Key:[id number] Status: denied as of 06/24/22 Reason:The requested medication and/or diagnosis are not a covered benefit and excluded from coverage in accordance with the terms and conditions of your plan benefit. Therefore, the request has been administratively denied Notified Pt via: Mychart    Initiated Prior authorization [id number] '40MG'$  dr capsules Via: Covermymeds Case/Key:[id number] Status: denied as of 06/27/22 Reason:Denied. This health benefit plan does not cover the following services, supplies, drugs or charges: Any drug that is therapeutically equivalent to an over-the-counter drug where the over-the-counter products contain the same active ingredients as the prescription product at the same, or similar, strengths. -OR- Drugs that are Purchased over-the-counter, unless specifically listed as a covered drug in the formulary and a written prescription is provided Notified Pt via: Mychart

## 2022-06-27 NOTE — Telephone Encounter (Signed)
Protonix denied. Omeprazole?

## 2022-07-22 ENCOUNTER — Encounter: Payer: Self-pay | Admitting: Physician Assistant

## 2022-07-22 DIAGNOSIS — R102 Pelvic and perineal pain: Secondary | ICD-10-CM

## 2022-07-22 DIAGNOSIS — S3210XS Unspecified fracture of sacrum, sequela: Secondary | ICD-10-CM

## 2022-07-22 DIAGNOSIS — S32810S Multiple fractures of pelvis with stable disruption of pelvic ring, sequela: Secondary | ICD-10-CM

## 2022-07-22 DIAGNOSIS — N501 Vascular disorders of male genital organs: Secondary | ICD-10-CM

## 2022-07-25 ENCOUNTER — Encounter: Payer: Self-pay | Admitting: Physician Assistant

## 2022-07-25 DIAGNOSIS — S32810S Multiple fractures of pelvis with stable disruption of pelvic ring, sequela: Secondary | ICD-10-CM

## 2022-07-25 DIAGNOSIS — R102 Pelvic and perineal pain: Secondary | ICD-10-CM

## 2022-07-25 DIAGNOSIS — N501 Vascular disorders of male genital organs: Secondary | ICD-10-CM

## 2022-07-25 DIAGNOSIS — S3210XS Unspecified fracture of sacrum, sequela: Secondary | ICD-10-CM

## 2022-07-25 MED ORDER — CYCLOBENZAPRINE HCL 10 MG PO TABS: ORAL_TABLET | ORAL | 1 refills | Status: DC

## 2022-07-25 MED ORDER — CLOBETASOL PROPIONATE 0.05 % EX CREA: 1.0000 | TOPICAL_CREAM | Freq: Two times a day (BID) | CUTANEOUS | 2 refills | Status: DC

## 2022-08-04 DIAGNOSIS — L209 Atopic dermatitis, unspecified: Secondary | ICD-10-CM | POA: Diagnosis not present

## 2022-08-04 DIAGNOSIS — L299 Pruritus, unspecified: Secondary | ICD-10-CM | POA: Diagnosis not present

## 2022-08-24 ENCOUNTER — Telehealth (INDEPENDENT_AMBULATORY_CARE_PROVIDER_SITE_OTHER): Payer: BC Managed Care – PPO | Admitting: Physician Assistant

## 2022-08-24 VITALS — BP 140/90 | Wt 237.0 lb

## 2022-08-24 DIAGNOSIS — G894 Chronic pain syndrome: Secondary | ICD-10-CM | POA: Diagnosis not present

## 2022-08-24 DIAGNOSIS — E538 Deficiency of other specified B group vitamins: Secondary | ICD-10-CM

## 2022-08-24 DIAGNOSIS — K219 Gastro-esophageal reflux disease without esophagitis: Secondary | ICD-10-CM

## 2022-08-24 DIAGNOSIS — I10 Essential (primary) hypertension: Secondary | ICD-10-CM

## 2022-08-24 DIAGNOSIS — M5136 Other intervertebral disc degeneration, lumbar region: Secondary | ICD-10-CM | POA: Diagnosis not present

## 2022-08-24 DIAGNOSIS — F411 Generalized anxiety disorder: Secondary | ICD-10-CM

## 2022-08-24 DIAGNOSIS — E781 Pure hyperglyceridemia: Secondary | ICD-10-CM

## 2022-08-24 DIAGNOSIS — E7800 Pure hypercholesterolemia, unspecified: Secondary | ICD-10-CM

## 2022-08-24 MED ORDER — HYDROCODONE-ACETAMINOPHEN 5-325 MG PO TABS: ORAL_TABLET | ORAL | 0 refills | Status: DC

## 2022-08-24 MED ORDER — CYANOCOBALAMIN 1000 MCG/ML IJ SOLN: 1000.0000 ug | INTRAMUSCULAR | 3 refills | Status: DC

## 2022-08-24 MED ORDER — ALPRAZOLAM 1 MG PO TABS: ORAL_TABLET | ORAL | 5 refills | Status: DC

## 2022-08-24 MED ORDER — ROSUVASTATIN CALCIUM 5 MG PO TABS
5.0000 mg | ORAL_TABLET | Freq: Every day | ORAL | 0 refills | Status: DC
Start: 2022-08-24 — End: 2022-11-11

## 2022-08-24 MED ORDER — LOSARTAN POTASSIUM 25 MG PO TABS: 25.0000 mg | ORAL_TABLET | Freq: Every day | ORAL | 0 refills | Status: DC

## 2022-08-24 MED ORDER — ESOMEPRAZOLE MAGNESIUM 40 MG PO PACK: 40.0000 mg | PACK | Freq: Every day | ORAL | 1 refills | Status: DC
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
## 2022-08-24 NOTE — Progress Notes (Unsigned)
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 He will need to do his appointment over the phone.   He hasn't taken the Atorvastatin in 2 days he stated that it made him feel bad.  Pt reports that he is needing refills on all of his medications.

## 2022-08-25 ENCOUNTER — Encounter: Payer: Self-pay | Admitting: Physician Assistant

## 2022-08-25 MED ORDER — ESOMEPRAZOLE MAGNESIUM 40 MG PO CPDR: 40.0000 mg | DELAYED_RELEASE_CAPSULE | Freq: Every day | ORAL | 3 refills | Status: DC

## 2022-09-19 DIAGNOSIS — L299 Pruritus, unspecified: Secondary | ICD-10-CM | POA: Diagnosis not present

## 2022-09-19 DIAGNOSIS — L209 Atopic dermatitis, unspecified: Secondary | ICD-10-CM | POA: Diagnosis not present

## 2022-09-19 DIAGNOSIS — D485 Neoplasm of uncertain behavior of skin: Secondary | ICD-10-CM | POA: Diagnosis not present

## 2022-09-19 DIAGNOSIS — L308 Other specified dermatitis: Secondary | ICD-10-CM | POA: Diagnosis not present

## 2022-10-26 ENCOUNTER — Encounter: Payer: Self-pay | Admitting: Physician Assistant

## 2022-10-27 NOTE — Telephone Encounter (Signed)
Spoke with patient. The prescription is at the pharmacy but was to be filled 10/31/22. Called pharmacy yesterday and authorized the early refill for today 10/27/22 with pharmacist. Patient informed as above.

## 2022-11-07 DIAGNOSIS — F17220 Nicotine dependence, chewing tobacco, uncomplicated: Secondary | ICD-10-CM | POA: Diagnosis not present

## 2022-11-07 DIAGNOSIS — Z86718 Personal history of other venous thrombosis and embolism: Secondary | ICD-10-CM | POA: Diagnosis not present

## 2022-11-07 DIAGNOSIS — R072 Precordial pain: Secondary | ICD-10-CM | POA: Diagnosis not present

## 2022-11-07 DIAGNOSIS — I498 Other specified cardiac arrhythmias: Secondary | ICD-10-CM | POA: Diagnosis not present

## 2022-11-07 DIAGNOSIS — R0789 Other chest pain: Secondary | ICD-10-CM | POA: Diagnosis not present

## 2022-11-07 DIAGNOSIS — Z9889 Other specified postprocedural states: Secondary | ICD-10-CM | POA: Diagnosis not present

## 2022-11-07 DIAGNOSIS — Z79899 Other long term (current) drug therapy: Secondary | ICD-10-CM | POA: Diagnosis not present

## 2022-11-07 DIAGNOSIS — R208 Other disturbances of skin sensation: Secondary | ICD-10-CM | POA: Diagnosis not present

## 2022-11-07 DIAGNOSIS — F419 Anxiety disorder, unspecified: Secondary | ICD-10-CM | POA: Diagnosis not present

## 2022-11-07 DIAGNOSIS — R9431 Abnormal electrocardiogram [ECG] [EKG]: Secondary | ICD-10-CM | POA: Diagnosis not present

## 2022-11-07 DIAGNOSIS — I10 Essential (primary) hypertension: Secondary | ICD-10-CM | POA: Diagnosis not present

## 2022-11-07 DIAGNOSIS — R079 Chest pain, unspecified: Secondary | ICD-10-CM | POA: Diagnosis not present

## 2022-11-07 DIAGNOSIS — R42 Dizziness and giddiness: Secondary | ICD-10-CM | POA: Diagnosis not present

## 2022-11-08 ENCOUNTER — Telehealth: Payer: Self-pay | Admitting: General Practice

## 2022-11-08 NOTE — Transitions of Care (Post Inpatient/ED Visit) (Signed)
11/08/2022  Name: Luke Villarreal MRN: [id number] DOB: 05/20/1975  Today's TOC FU Call Status: Today's TOC FU Call Status:: Successful TOC FU Call Competed TOC FU Call Complete Date: 11/08/22  Transition Care Management Follow-up Telephone Call Date of Discharge: 11/07/22 Discharge Facility: Other ([facility]) Name of Other ([facility]) Discharge Facility: [facility] Type of Discharge: Emergency Department Reason for ED Visit: Cardiac Conditions Cardiac Conditions Diagnosis: Chest Pain Persisting (feels it was a panic attack) How have you been since you were released from the hospital?: Better Any questions or concerns?: No  Items Reviewed: Did you receive and understand the discharge instructions provided?: Yes Medications obtained,verified, and reconciled?: Yes (Medications Reviewed) Any new allergies since your discharge?: No Dietary orders reviewed?: NA Do you have support at home?: Yes  Medications Reviewed Today: Medications Reviewed Today     Reviewed by Modesto Charon, RN (Registered Nurse) on 11/08/22 at [phone]  Med List Status: <None>   Medication Order Taking? Sig Documenting Provider Last Dose Status Informant  ALPRAZolam (XANAX) 1 MG tablet [id number] Yes TAKE 1 TABLET BY MOUTH AT BEDTIME, DO NOT TAKE WITH NORCO Breeback, Jade L, PA-C Taking Active   atorvastatin (LIPITOR) 40 MG tablet [id number] No Take 1 tablet (40 mg total) by mouth daily.  Patient not taking: Reported on 08/24/2022   Jomarie Longs, [place] Not Taking Active   clobetasol cream (TEMOVATE) 0.05 % [id number] Yes Apply 1 Application topically 2 (two) times daily. Jomarie Longs, PA-C Taking Active   cyanocobalamin (VITAMIN B12) 1000 MCG/ML injection [id number] Yes Inject 1 mL (1,000 mcg total) into the skin every 30 (thirty) days. Jomarie Longs, PA-C Taking Active   cyclobenzaprine (FLEXERIL) 10 MG tablet [id number] Yes TAKE 1 TABLET 3 TIMES DAILY IF NEEDED FOR MUSCLE SPASMS Breeback, Jade L, PA-C Taking  Active   esomeprazole (NEXIUM) 40 MG capsule [id number] Yes Take 1 capsule (40 mg total) by mouth daily. Jomarie Longs, PA-C Taking Active   esomeprazole (NEXIUM) 40 MG packet [id number] Yes Take 40 mg by mouth daily before breakfast. Caleen Essex, Jade L, PA-C Taking Active   fluticasone (FLONASE) 50 MCG/ACT nasal spray [id number] Yes Place 2 sprays into both nostrils daily. Jomarie Longs, PA-C Taking Active   HYDROcodone-acetaminophen (NORCO/VICODIN) 5-325 MG tablet [id number] Yes Take one tablet by mouth once a day. Jomarie Longs, PA-C Taking Active   HYDROcodone-acetaminophen (NORCO/VICODIN) 5-325 MG tablet [id number] Yes Take one tablet as needed for moderate to severe pain. Jomarie Longs, PA-C Taking Active   HYDROcodone-acetaminophen (NORCO/VICODIN) 5-325 MG tablet [id number] Yes Take one tablet once daily as needed for pain. Jomarie Longs, PA-C Taking Active   hydrocortisone (ANUSOL-HC) 2.5 % rectal cream [id number] Yes Place 1 application. rectally 2 (two) times daily. Jomarie Longs, PA-C Taking Active   levocetirizine (XYZAL) 5 MG tablet [id number] Yes Take 1 tablet (5 mg total) by mouth every evening. Jomarie Longs, PA-C Taking Active   losartan (COZAAR) 25 MG tablet [id number] No Take 1 tablet (25 mg total) by mouth daily.  Patient not taking: Reported on 11/08/2022   Jomarie Longs, PA-C Not Taking Active   montelukast (SINGULAIR) 10 MG tablet [id number] Yes Take 1 tablet (10 mg total) by mouth at bedtime. Jomarie Longs, PA-C Taking Active   NEEDLE, DISP, 18 G 18G X 1" MISC [id number] Yes Use to draw up vitamin B12. Jomarie Longs, PA-C Taking Active   rosuvastatin (CRESTOR) 5 MG tablet [id number] No Take 1 tablet (5 mg total)  by mouth daily.  Patient not taking: Reported on 11/08/2022   Jomarie Longs, PA-C Not Taking Active   Syringe/Needle, Disp, (SYRINGE 3CC/25GX1-1/2") 25G X 1-1/2" 3 ML MISC [id number] Yes Inject vitamin B 12 into skin once a month. Jomarie Longs, PA-C  Taking Active   triamcinolone cream (KENALOG) 0.1 % [id number] Yes Apply 1 Application topically 2 (two) times daily. Jomarie Longs, PA-C Taking Active             Home Care and Equipment/Supplies: Were Home Health Services Ordered?: NA Any new equipment or medical supplies ordered?: NA  Functional Questionnaire: Do you need assistance with bathing/showering or dressing?: No Do you need assistance with meal preparation?: No Do you need assistance with eating?: No Do you have difficulty maintaining continence: No Do you need assistance with getting out of bed/getting out of a chair/moving?: No Do you have difficulty managing or taking your medications?: No  Follow up appointments reviewed: PCP Follow-up appointment confirmed?: Yes Date of PCP follow-up appointment?: 11/10/22 Follow-up Provider: Tandy Gaw PA Specialist Hospital Follow-up appointment confirmed?: No Reason Specialist Follow-Up Not Confirmed:  (Patient would like to follow up with PCP.) Do you need transportation to your follow-up appointment?: No Do you understand care options if your condition(s) worsen?: Yes-patient verbalized understanding  SDOH Interventions Today    Flowsheet Row Most Recent Value  SDOH Interventions   Transportation Interventions Intervention Not Indicated       SIGNATURE Modesto Charon, RN BSN Nurse Health Advisor

## 2022-11-10 ENCOUNTER — Encounter: Payer: Self-pay | Admitting: Physician Assistant

## 2022-11-10 ENCOUNTER — Ambulatory Visit (INDEPENDENT_AMBULATORY_CARE_PROVIDER_SITE_OTHER): Payer: BC Managed Care – PPO | Admitting: Physician Assistant

## 2022-11-10 ENCOUNTER — Other Ambulatory Visit: Payer: Self-pay | Admitting: Physician Assistant

## 2022-11-10 VITALS — BP 152/100 | HR 92 | Ht 68.0 in | Wt 236.0 lb

## 2022-11-10 DIAGNOSIS — Z9189 Other specified personal risk factors, not elsewhere classified: Secondary | ICD-10-CM

## 2022-11-10 DIAGNOSIS — R079 Chest pain, unspecified: Secondary | ICD-10-CM | POA: Diagnosis not present

## 2022-11-10 DIAGNOSIS — I10 Essential (primary) hypertension: Secondary | ICD-10-CM | POA: Diagnosis not present

## 2022-11-10 DIAGNOSIS — M5136 Other intervertebral disc degeneration, lumbar region: Secondary | ICD-10-CM

## 2022-11-10 DIAGNOSIS — F410 Panic disorder [episodic paroxysmal anxiety] without agoraphobia: Secondary | ICD-10-CM | POA: Diagnosis not present

## 2022-11-10 DIAGNOSIS — E781 Pure hyperglyceridemia: Secondary | ICD-10-CM

## 2022-11-10 DIAGNOSIS — F411 Generalized anxiety disorder: Secondary | ICD-10-CM

## 2022-11-10 DIAGNOSIS — G894 Chronic pain syndrome: Secondary | ICD-10-CM

## 2022-11-10 MED ORDER — SERTRALINE HCL 25 MG PO TABS
25.0000 mg | ORAL_TABLET | Freq: Every day | ORAL | 1 refills | Status: AC
Start: 2022-11-10 — End: ?

## 2022-11-10 MED ORDER — HYDROXYZINE PAMOATE 25 MG PO CAPS
25.0000 mg | ORAL_CAPSULE | Freq: Three times a day (TID) | ORAL | 0 refills | Status: DC | PRN
Start: 2022-11-10 — End: 2023-01-04

## 2022-11-10 MED ORDER — LOSARTAN POTASSIUM 25 MG PO TABS: 25.0000 mg | ORAL_TABLET | Freq: Every day | ORAL | 0 refills | Status: DC

## 2022-11-10 NOTE — Progress Notes (Signed)
Established Patient Office Visit  Subjective   Patient ID: Luke Villarreal, male    DOB: January 03, 1976  Age: 47 y.o. MRN: [id number]  Chief Complaint  Patient presents with   Hospitalization Follow-up    HPI Pt is a 47 yo obese male who presents to the clinic for hospital follow up for chest pain. He went to ED on 7/8 for CP and tingling feeling. Cardiac work up was done and negative enzymes, EKG and CXR. Suggested he was having panic attack and due to anxiety since he calmed down in ED and symptoms resolved.   He admits he has felt more anxiety lately with his mother being sick. No SI/HC. He feels like xanax needs to be increased.   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) 02/22/2020   History of DVT (deep vein thrombosis) 02/22/2020   Blood in stool 02/22/2020   Right leg pain 02/22/2020   Right ankle swelling 11/15/2019   Muscle cramps 11/15/2019   Chronic pain syndrome 08/18/2018   Lumbar degenerative disc disease 06/20/2018   Allergic conjunctivitis of both eyes 06/20/2018   Chronic allergic rhinitis due to pollen 06/20/2018   Closed pelvic ring fracture (HCC) 03/20/2018   Sacral fracture, closed (HCC) 03/20/2018   Pelvic hematoma, male 03/20/2018   B12 deficiency 09/20/2016   Anxiety state 09/20/2016   Male hypogonadism 03/08/2016   Hypertriglyceridemia 11/26/2015   Low HDL (under 40) 11/26/2015   Anterolisthesis 11/26/2015   Pelvic pain 11/25/2015   Bilateral carpal tunnel syndrome 11/25/2015   Hematuria 11/25/2015   Gastroesophageal reflux disease without esophagitis 09/21/2015    Rhinitis, allergic 09/21/2015   Panic attacks 09/21/2015   Seasonal allergies 09/21/2015   Hemorrhoid 09/21/2015   History of asthma 09/21/2015   History reviewed. No pertinent past medical history. History reviewed. No pertinent surgical history. Family History  Problem Relation Age of Onset   Hyperlipidemia Mother    Hypertension Mother    Hypertension Father    Hyperlipidemia Father    Allergies  Allergen Reactions   Buspar [Buspirone]     Makes feels weird   Lipitor [Atorvastatin]     achy   Prozac [Fluoxetine Hcl]     Dazed out and cannot function      ROS See HPI.    Objective:     BP (!) 158/103   Pulse 92   Ht 5\' 8"  (1.727 m)   Wt 236 lb (107 kg)   SpO2 99%   BMI 35.88 kg/m  BP Readings from Last 3 Encounters:  11/10/22 (!) 158/103  08/24/22 (!) 140/90  05/24/22 (!) 142/98   Wt Readings from Last 3 Encounters:  11/10/22 236 lb (107 kg)  08/24/22 237 lb (107.5 kg)  05/24/22 246 lb (111.6 kg)    ..    11/10/2022    3:59 PM 08/24/2022    1:27 PM 05/24/2022   10:19 AM 02/02/2022    1:52 PM 11/15/2019    1:30 PM  Depression screen PHQ 2/9  Decreased Interest 0 0 0 0 0  Down, Depressed, Hopeless 0 0 0 0 0  PHQ - 2 Score 0 0 0 0 0  Altered sleeping 3    0  Tired, decreased energy 1    0  Change in appetite 0    0  Feeling bad or failure about yourself  0    0  Trouble concentrating 0    0  Moving slowly or fidgety/restless 0    0  Suicidal thoughts 0    0  PHQ-9 Score 4    0  Difficult doing work/chores Not difficult at all       .Luke Villarreal Kitchen    11/10/2022    4:00 PM 05/07/2019    4:16 PM 04/12/2017    3:48 PM  GAD 7 : Generalized Anxiety Score  Nervous, Anxious, on Edge 2 3 2   Control/stop worrying 2 3 1   Worry too much - different things 2 3 1   Trouble relaxing 2 3 1   Restless 2 1 0  Easily annoyed or irritable 0 1 0  Afraid - awful might happen 1 1 1   Total GAD 7 Score 11 15 6   Anxiety Difficulty Somewhat difficult Not difficult at all        Physical Exam Constitutional:      Appearance: Normal appearance. He is obese.  HENT:     Head: Normocephalic.  Neck:     Vascular: No carotid bruit.  Cardiovascular:     Rate and Rhythm: Normal rate and regular rhythm.     Pulses: Normal pulses.     Heart sounds: Normal heart sounds.  Pulmonary:     Effort: Pulmonary effort is normal.     Breath sounds: Normal breath sounds.  Musculoskeletal:     Cervical back: No rigidity or tenderness.     Right lower leg: No edema.     Left lower leg: No edema.  Neurological:     General: No focal deficit present.     Mental Status: He is alert and oriented to person, place, and time.  Psychiatric:        Mood and Affect: Mood normal.       Last CBC Lab Results  Component Value Date   WBC 6.4 05/24/2022   HGB 15.7 05/24/2022   HCT 46.8 05/24/2022   MCV 88.0 05/24/2022   MCH 29.5 05/24/2022   RDW 12.3 05/24/2022   PLT 238 05/24/2022   Last metabolic panel Lab Results  Component Value Date   GLUCOSE 98 05/24/2022   NA 139 05/24/2022   K 4.3 05/24/2022   CL 99 05/24/2022   CO2 29 05/24/2022   BUN 15 05/24/2022   CREATININE 1.12 05/24/2022   EGFR 83 05/24/2022   CALCIUM 9.7 05/24/2022   PROT 7.9 05/24/2022   ALBUMIN 4.1 11/25/2015   BILITOT 1.0 05/24/2022   ALKPHOS 47 11/25/2015   AST 19 05/24/2022   ALT 32 05/24/2022   Last lipids Lab Results  Component Value Date   CHOL 232 (H) 05/24/2022   HDL 39 (L) 05/24/2022   LDLCALC 147 (H) 05/24/2022   TRIG 283 (H) 05/24/2022   CHOLHDL 5.9 (H) 05/24/2022   Last hemoglobin A1c No results found for: "HGBA1C" Last thyroid functions Lab Results  Component Value Date   TSH 2.89 05/24/2022      The 10-year ASCVD risk score (Arnett DK, et al., 2019) is: 16.3%    Assessment & Plan:  Luke Villarreal KitchenMarland KitchenWebber was seen today for hospitalization follow-up.  Diagnoses and all orders for this visit:  Chest pain, unspecified type -  EXERCISE TOLERANCE TEST (ETT); Future -      Cardiac Stress Test: Informed Consent Details: Physician/Practitioner Attestation; Transcribe to consent form and obtain patient signature  GAD (generalized anxiety disorder) -     sertraline (ZOLOFT) 25 MG tablet; Take 1 tablet (25 mg total) by mouth daily. At bedtime. -     hydrOXYzine (VISTARIL) 25 MG capsule; Take 1 capsule (25 mg total) by mouth every 8 (eight) hours as needed for anxiety.  Panic attacks -     hydrOXYzine (VISTARIL) 25 MG capsule; Take 1 capsule (25 mg total) by mouth every 8 (eight) hours as needed for anxiety.  Primary hypertension -     losartan (COZAAR) 25 MG tablet; Take 1 tablet (25 mg total) by mouth daily. At bedtime.  Chronic pain syndrome  Hypertriglyceridemia -     EXERCISE TOLERANCE TEST (ETT); Future -     Cardiac Stress Test: Informed Consent Details: Physician/Practitioner Attestation; Transcribe to consent form and obtain patient signature  At high risk for cardiovascular disease -     EXERCISE TOLERANCE TEST (ETT); Future -     Cardiac Stress Test: Informed Consent Details: Physician/Practitioner Attestation; Transcribe to consent form and obtain patient signature   PHQ/GAD fairly stable but does appear having panic attack and symptoms more frequently He is high risk for CV event, stress test ordered Discussed importance of staying on BP medication and cholesterol medication Resent cozaar for BP, discussed elevated BP today Called pharmacy and oked early refill of xanax but explained we are NOT increasing dosing he needs to stay on no more than once a day and NOT with Norco Hydroxyzine if feeling more anxiety panic and used xanax Start zoloft daily, discussed side effects Follow up in 4 weeks Norco refilled for next pick up 11/25/2022 .Luke Villarreal KitchenPDMP not reviewed this encounter. Pain contract UTD.  UDS up to date.    Return for 4-6 weeks for F/U on depression/anxiety.  And BP follow up.    Tandy Gaw, PA-C

## 2022-11-10 NOTE — Patient Instructions (Signed)
Start zoloft and cozaar at bedtime.  Vistaril is only during the day when feeling panic attack coming on. Reserve xanax for at night only.

## 2022-11-10 NOTE — Telephone Encounter (Signed)
Requesting rx rf of hydrocodone / acet  5-325 Last written 10/31/2022 Last OV today 11/10/2022 Next schld appt  12/12/2022

## 2022-11-11 ENCOUNTER — Other Ambulatory Visit: Payer: Self-pay | Admitting: Physician Assistant

## 2022-11-11 DIAGNOSIS — M5136 Other intervertebral disc degeneration, lumbar region: Secondary | ICD-10-CM

## 2022-11-11 DIAGNOSIS — N501 Vascular disorders of male genital organs: Secondary | ICD-10-CM

## 2022-11-11 DIAGNOSIS — S32810S Multiple fractures of pelvis with stable disruption of pelvic ring, sequela: Secondary | ICD-10-CM

## 2022-11-11 DIAGNOSIS — S3210XS Unspecified fracture of sacrum, sequela: Secondary | ICD-10-CM

## 2022-11-11 DIAGNOSIS — G894 Chronic pain syndrome: Secondary | ICD-10-CM

## 2022-11-11 DIAGNOSIS — R102 Pelvic and perineal pain: Secondary | ICD-10-CM

## 2022-11-11 MED ORDER — PITAVASTATIN CALCIUM 4 MG PO TABS: 1.0000 | ORAL_TABLET | Freq: Every day | ORAL | 3 refills | Status: DC

## 2022-11-11 MED ORDER — HYDROCODONE-ACETAMINOPHEN 5-325 MG PO TABS: ORAL_TABLET | ORAL | 0 refills | Status: DC

## 2022-11-19 ENCOUNTER — Telehealth: Payer: BC Managed Care – PPO | Admitting: Nurse Practitioner

## 2022-11-19 DIAGNOSIS — J069 Acute upper respiratory infection, unspecified: Secondary | ICD-10-CM

## 2022-11-19 MED ORDER — BENZONATATE 100 MG PO CAPS: 100.0000 mg | ORAL_CAPSULE | Freq: Three times a day (TID) | ORAL | 0 refills | Status: DC | PRN

## 2022-11-19 MED ORDER — AMOXICILLIN 500 MG PO CAPS: 500.0000 mg | ORAL_CAPSULE | Freq: Three times a day (TID) | ORAL | 0 refills | Status: AC

## 2022-11-19 NOTE — Progress Notes (Signed)
E-Visit for Cough  We are sorry that you are not feeling well.  Here is how we plan to help!  Based on your presentation I believe you most likely have A cough due to a virus.  This is called viral bronchitis and is best treated by rest, plenty of fluids and control of the cough.  You may use Ibuprofen or Tylenol as directed to help your symptoms.     In addition you may use A prescription cough medication called Tessalon Perles 100mg. You may take 1-2 capsules every 8 hours as needed for your cough.   From your responses in the eVisit questionnaire you describe inflammation in the upper respiratory tract which is causing a significant cough.  This is commonly called Bronchitis and has four common causes:   Allergies Viral Infections Acid Reflux Bacterial Infection Allergies, viruses and acid reflux are treated by controlling symptoms or eliminating the cause. An example might be a cough caused by taking certain blood pressure medications. You stop the cough by changing the medication. Another example might be a cough caused by acid reflux. Controlling the reflux helps control the cough.  USE OF BRONCHODILATOR ("RESCUE") INHALERS: There is a risk from using your bronchodilator too frequently.  The risk is that over-reliance on a medication which only relaxes the muscles surrounding the breathing tubes can reduce the effectiveness of medications prescribed to reduce swelling and congestion of the tubes themselves.  Although you feel brief relief from the bronchodilator inhaler, your asthma may actually be worsening with the tubes becoming more swollen and filled with mucus.  This can delay other crucial treatments, such as oral steroid medications. If you need to use a bronchodilator inhaler daily, several times per day, you should discuss this with your provider.  There are probably better treatments that could be used to keep your asthma under control.     HOME CARE Only take medications as  instructed by your medical team. Complete the entire course of an antibiotic. Drink plenty of fluids and get plenty of rest. Avoid close contacts especially the very young and the elderly Cover your mouth if you cough or cough into your sleeve. Always remember to wash your hands A steam or ultrasonic humidifier can help congestion.   GET HELP RIGHT AWAY IF: You develop worsening fever. You become short of breath You cough up blood. Your symptoms persist after you have completed your treatment plan MAKE SURE YOU  Understand these instructions. Will watch your condition. Will get help right away if you are not doing well or get worse.    Thank you for choosing an e-visit.  Your e-visit answers were reviewed by a board certified advanced clinical practitioner to complete your personal care plan. Depending upon the condition, your plan could have included both over the counter or prescription medications.  Please review your pharmacy choice. Make sure the pharmacy is open so you can pick up prescription now. If there is a problem, you may contact your provider through MyChart messaging and have the prescription routed to another pharmacy.  Your safety is important to us. If you have drug allergies check your prescription carefully.   For the next 24 hours you can use MyChart to ask questions about today's visit, request a non-urgent call back, or ask for a work or school excuse. You will get an email in the next two days asking about your experience. I hope that your e-visit has been valuable and will speed your recovery.    Luke Martin, FNP   5-10 minutes spent reviewing and documenting in chart.  

## 2022-11-19 NOTE — Addendum Note (Signed)
Addended by: Bennie Pierini on: 11/19/2022 01:56 PM   Modules accepted: Orders

## 2022-11-19 NOTE — Addendum Note (Signed)
Addended by: Bennie Pierini on: 11/19/2022 01:50 PM   Modules accepted: Orders

## 2022-11-19 NOTE — Addendum Note (Signed)
Addended by: Bennie Pierini on: 11/19/2022 02:21 PM   Modules accepted: Level of Service

## 2022-11-21 ENCOUNTER — Encounter: Payer: Self-pay | Admitting: Physician Assistant

## 2022-11-22 MED ORDER — PREDNISONE 20 MG PO TABS: 20.0000 mg | ORAL_TABLET | Freq: Two times a day (BID) | ORAL | 0 refills | Status: DC

## 2022-11-28 DIAGNOSIS — L209 Atopic dermatitis, unspecified: Secondary | ICD-10-CM | POA: Diagnosis not present

## 2022-11-28 DIAGNOSIS — L299 Pruritus, unspecified: Secondary | ICD-10-CM | POA: Diagnosis not present

## 2022-12-12 ENCOUNTER — Ambulatory Visit: Payer: PRIVATE HEALTH INSURANCE | Admitting: Physician Assistant

## 2022-12-12 DIAGNOSIS — F411 Generalized anxiety disorder: Secondary | ICD-10-CM

## 2022-12-26 DIAGNOSIS — L299 Pruritus, unspecified: Secondary | ICD-10-CM | POA: Diagnosis not present

## 2022-12-26 DIAGNOSIS — L209 Atopic dermatitis, unspecified: Secondary | ICD-10-CM | POA: Diagnosis not present

## 2023-01-03 ENCOUNTER — Encounter: Payer: Self-pay | Admitting: Physician Assistant

## 2023-01-25 ENCOUNTER — Encounter: Payer: Self-pay | Admitting: Physician Assistant

## 2023-01-25 ENCOUNTER — Other Ambulatory Visit: Payer: Self-pay | Admitting: Physician Assistant

## 2023-01-25 ENCOUNTER — Encounter: Payer: BC Managed Care – PPO | Admitting: Physician Assistant

## 2023-01-25 DIAGNOSIS — M5136 Other intervertebral disc degeneration, lumbar region: Secondary | ICD-10-CM

## 2023-01-25 DIAGNOSIS — G894 Chronic pain syndrome: Secondary | ICD-10-CM

## 2023-01-25 MED ORDER — HYDROCODONE-ACETAMINOPHEN 5-325 MG PO TABS: ORAL_TABLET | ORAL | 0 refills | Status: DC

## 2023-01-25 NOTE — Telephone Encounter (Signed)
..  PDMP reviewed during this encounter. No concerns Last fill 8/25 Scheduled 9/30 in office appt

## 2023-01-26 NOTE — Progress Notes (Signed)
This encounter was created in error - please disregard.

## 2023-01-30 ENCOUNTER — Ambulatory Visit (INDEPENDENT_AMBULATORY_CARE_PROVIDER_SITE_OTHER): Payer: BC Managed Care – PPO | Admitting: Physician Assistant

## 2023-01-30 ENCOUNTER — Encounter: Payer: Self-pay | Admitting: Physician Assistant

## 2023-01-30 VITALS — BP 157/97 | HR 99 | Ht 68.0 in | Wt 240.0 lb

## 2023-01-30 DIAGNOSIS — G894 Chronic pain syndrome: Secondary | ICD-10-CM | POA: Diagnosis not present

## 2023-01-30 DIAGNOSIS — E538 Deficiency of other specified B group vitamins: Secondary | ICD-10-CM | POA: Diagnosis not present

## 2023-01-30 DIAGNOSIS — I10 Essential (primary) hypertension: Secondary | ICD-10-CM | POA: Diagnosis not present

## 2023-01-30 DIAGNOSIS — F411 Generalized anxiety disorder: Secondary | ICD-10-CM | POA: Diagnosis not present

## 2023-01-30 DIAGNOSIS — Z1211 Encounter for screening for malignant neoplasm of colon: Secondary | ICD-10-CM

## 2023-01-30 DIAGNOSIS — M5136 Other intervertebral disc degeneration, lumbar region with discogenic back pain only: Secondary | ICD-10-CM

## 2023-01-30 DIAGNOSIS — F5101 Primary insomnia: Secondary | ICD-10-CM

## 2023-01-30 DIAGNOSIS — M51369 Other intervertebral disc degeneration, lumbar region without mention of lumbar back pain or lower extremity pain: Secondary | ICD-10-CM

## 2023-01-30 MED ORDER — LOSARTAN POTASSIUM 50 MG PO TABS
50.0000 mg | ORAL_TABLET | Freq: Every day | ORAL | 0 refills | Status: DC
Start: 2023-01-30 — End: 2023-04-30

## 2023-01-30 MED ORDER — MIRTAZAPINE 7.5 MG PO TABS
7.5000 mg | ORAL_TABLET | Freq: Every day | ORAL | 0 refills | Status: DC
Start: 2023-01-30 — End: 2023-05-15

## 2023-01-30 MED ORDER — SERTRALINE HCL 25 MG PO TABS: 25.0000 mg | ORAL_TABLET | Freq: Every day | ORAL | 1 refills | Status: DC

## 2023-01-31 ENCOUNTER — Encounter: Payer: Self-pay | Admitting: Physician Assistant

## 2023-01-31 DIAGNOSIS — F411 Generalized anxiety disorder: Secondary | ICD-10-CM

## 2023-01-31 MED ORDER — HYDROCODONE-ACETAMINOPHEN 5-325 MG PO TABS: ORAL_TABLET | ORAL | 0 refills | Status: DC

## 2023-01-31 MED ORDER — CYANOCOBALAMIN 1000 MCG/ML IJ SOLN
1000.0000 ug | INTRAMUSCULAR | 0 refills | Status: DC
Start: 2023-01-31 — End: 2023-10-24

## 2023-01-31 MED ORDER — ALPRAZOLAM 1 MG PO TABS
ORAL_TABLET | ORAL | 5 refills | Status: DC
Start: 2023-02-03 — End: 2023-05-15

## 2023-01-31 NOTE — Progress Notes (Unsigned)
Established Patient Office Visit  Subjective   Patient ID: Luke Villarreal, male    DOB: 03-11-1976  Age: 47 y.o. MRN: [id number]  No chief complaint on file.   HPI Pt is a 47 yo obese male with HTN, GERD, GAD, panic attacks, Depression who presents to the clinic for follow up and medication refills.   Pt is doing ok. He has noticed his BP has been elevated at home in the 150s over 80s. He denies any CP, palpitations, headaches or vision changes.   GERD no issues.   Anxiety and Depression well controlled on zoloft and xanax once a day.  He continues to have problems sleeping. Trazodone did not work.   Continues to have chronic pain and needs norco refilled.     .. Active Ambulatory Problems    Diagnosis Date Noted   Gastroesophageal reflux disease without esophagitis 09/21/2015   Rhinitis, allergic 09/21/2015   Panic attacks 09/21/2015   Seasonal allergies 09/21/2015   Hemorrhoid 09/21/2015   History of asthma 09/21/2015   Pelvic pain 11/25/2015   Bilateral carpal tunnel syndrome 11/25/2015   Hematuria 11/25/2015   Hypertriglyceridemia 11/26/2015   Low HDL (under 40) 11/26/2015   Anterolisthesis 11/26/2015   Male hypogonadism 03/08/2016   B12 deficiency 09/20/2016   Anxiety state 09/20/2016   Closed pelvic ring fracture (HCC) 03/20/2018   Sacral fracture, closed (HCC) 03/20/2018   Pelvic hematoma, male 03/20/2018   Lumbar degenerative disc disease 06/20/2018   Allergic conjunctivitis of both eyes 06/20/2018   Chronic allergic rhinitis due to pollen 06/20/2018   Chronic pain syndrome 08/18/2018   Right ankle swelling 11/15/2019   Muscle cramps 11/15/2019   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) 02/22/2020   History of DVT (deep vein thrombosis) 02/22/2020   Blood in stool 02/22/2020   Right leg pain 02/22/2020   Chronic pelvic pain in male 08/28/2020   Nausea 04/27/2021   Lichen planus 02/03/2022   Milia 02/03/2022   Elevated blood pressure  reading 05/24/2022   Acute pain of right knee 05/24/2022   Elevated LDL cholesterol level 05/25/2022   GAD (generalized anxiety disorder) 11/10/2022   Primary hypertension 11/10/2022   Resolved Ambulatory Problems    Diagnosis Date Noted   Gastroesophageal reflux disease with esophagitis 08/18/2018   No Additional Past Medical History     ROS   See HPI>  Objective:     BP (!) 157/97   Pulse 99   Ht 5\' 8"  (1.727 m)   Wt 240 lb (108.9 kg)   SpO2 99%   BMI 36.49 kg/m  BP Readings from Last 3 Encounters:  01/30/23 (!) 157/97  11/10/22 (!) 152/100  08/24/22 (!) 140/90   Wt Readings from Last 3 Encounters:  01/30/23 240 lb (108.9 kg)  11/10/22 236 lb (107 kg)  08/24/22 237 lb (107.5 kg)      Physical Exam Constitutional:      Appearance: Normal appearance. He is obese.  Cardiovascular:     Rate and Rhythm: Normal rate and regular rhythm.     Pulses: Normal pulses.  Pulmonary:     Effort: Pulmonary effort is normal.  Musculoskeletal:     Right lower leg: No edema.     Left lower leg: No edema.  Neurological:     General: No focal deficit present.     Mental Status: He is alert and oriented to person, place, and time.  Psychiatric:        Mood and  Affect: Mood normal.        The 10-year ASCVD risk score (Arnett DK, et al., 2019) is: 16.2%    Assessment & Plan:  Marland KitchenMarland KitchenDiagnoses and all orders for this visit:  Primary hypertension -     losartan (COZAAR) 50 MG tablet; Take 1 tablet (50 mg total) by mouth daily.  Colon cancer screening -     Cologuard  GAD (generalized anxiety disorder) -     sertraline (ZOLOFT) 25 MG tablet; Take 1 tablet (25 mg total) by mouth daily. At bedtime.  Lumbar degenerative disc disease -     HYDROcodone-acetaminophen (NORCO/VICODIN) 5-325 MG tablet; Take one tablet once daily as needed for pain. -     HYDROcodone-acetaminophen (NORCO/VICODIN) 5-325 MG tablet; Take one tablet by mouth once a day. -      HYDROcodone-acetaminophen (NORCO/VICODIN) 5-325 MG tablet; Take one tablet as needed for moderate to severe pain.  B12 deficiency -     cyanocobalamin (VITAMIN B12) 1000 MCG/ML injection; Inject 1 mL (1,000 mcg total) into the muscle every 30 (thirty) days.  Chronic pain syndrome -     HYDROcodone-acetaminophen (NORCO/VICODIN) 5-325 MG tablet; Take one tablet once daily as needed for pain. -     HYDROcodone-acetaminophen (NORCO/VICODIN) 5-325 MG tablet; Take one tablet by mouth once a day. -     HYDROcodone-acetaminophen (NORCO/VICODIN) 5-325 MG tablet; Take one tablet as needed for moderate to severe pain.  Anxiety state -     ALPRAZolam (XANAX) 1 MG tablet; TAKE 1 TABLET BY MOUTH AT BEDTIME, DO NOT TAKE WITH NORCO  Primary insomnia -     mirtazapine (REMERON) 7.5 MG tablet; Take 1 tablet (7.5 mg total) by mouth at bedtime.   BP not to goal Increased cozaar to 50mg  Recheck in 4 weeks  B12-refilled  Pain contract UTD .Marland KitchenPDMP reviewed during this encounter. Post dated norco and xanax to when they can be filled 10/24 for norco 10/4 for xanax Follow up in 3 months  Start remeron for insomnia at bedtime  Continue zoloft for mood.   Declined colonoscopy, agreed to cologuard.    Return in about 4 weeks (around 02/27/2023) for nurse visit BP recheck.    Tandy Gaw, PA-C

## 2023-02-06 DIAGNOSIS — Z1211 Encounter for screening for malignant neoplasm of colon: Secondary | ICD-10-CM | POA: Diagnosis not present

## 2023-02-11 LAB — COLOGUARD: COLOGUARD: NEGATIVE

## 2023-02-12 NOTE — Progress Notes (Signed)
Normal cologuard. Next screening in 3 years.

## 2023-02-27 ENCOUNTER — Ambulatory Visit: Payer: BC Managed Care – PPO

## 2023-04-29 ENCOUNTER — Other Ambulatory Visit: Payer: Self-pay | Admitting: Physician Assistant

## 2023-04-29 DIAGNOSIS — I10 Essential (primary) hypertension: Secondary | ICD-10-CM

## 2023-04-30 ENCOUNTER — Other Ambulatory Visit: Payer: Self-pay | Admitting: Physician Assistant

## 2023-04-30 DIAGNOSIS — I10 Essential (primary) hypertension: Secondary | ICD-10-CM

## 2023-05-01 ENCOUNTER — Ambulatory Visit (INDEPENDENT_AMBULATORY_CARE_PROVIDER_SITE_OTHER): Payer: BC Managed Care – PPO | Admitting: Physician Assistant

## 2023-05-01 ENCOUNTER — Encounter: Payer: Self-pay | Admitting: Physician Assistant

## 2023-05-01 VITALS — BP 138/86 | HR 88 | Resp 12 | Ht 68.0 in | Wt 244.3 lb

## 2023-05-01 DIAGNOSIS — E781 Pure hyperglyceridemia: Secondary | ICD-10-CM

## 2023-05-01 DIAGNOSIS — F5101 Primary insomnia: Secondary | ICD-10-CM | POA: Insufficient documentation

## 2023-05-01 DIAGNOSIS — E7800 Pure hypercholesterolemia, unspecified: Secondary | ICD-10-CM | POA: Diagnosis not present

## 2023-05-01 DIAGNOSIS — G894 Chronic pain syndrome: Secondary | ICD-10-CM

## 2023-05-01 DIAGNOSIS — R0681 Apnea, not elsewhere classified: Secondary | ICD-10-CM | POA: Insufficient documentation

## 2023-05-01 DIAGNOSIS — Z9189 Other specified personal risk factors, not elsewhere classified: Secondary | ICD-10-CM | POA: Insufficient documentation

## 2023-05-01 DIAGNOSIS — E66812 Obesity, class 2: Secondary | ICD-10-CM

## 2023-05-01 DIAGNOSIS — E6609 Other obesity due to excess calories: Secondary | ICD-10-CM

## 2023-05-01 DIAGNOSIS — M5136 Other intervertebral disc degeneration, lumbar region with discogenic back pain only: Secondary | ICD-10-CM

## 2023-05-01 DIAGNOSIS — I10 Essential (primary) hypertension: Secondary | ICD-10-CM

## 2023-05-01 DIAGNOSIS — Z6837 Body mass index (BMI) 37.0-37.9, adult: Secondary | ICD-10-CM

## 2023-05-01 DIAGNOSIS — Z1211 Encounter for screening for malignant neoplasm of colon: Secondary | ICD-10-CM

## 2023-05-01 DIAGNOSIS — R7301 Impaired fasting glucose: Secondary | ICD-10-CM | POA: Diagnosis not present

## 2023-05-01 MED ORDER — HYDROCODONE-ACETAMINOPHEN 5-325 MG PO TABS: ORAL_TABLET | ORAL | 0 refills | Status: DC

## 2023-05-01 MED ORDER — LOSARTAN POTASSIUM 100 MG PO TABS
100.0000 mg | ORAL_TABLET | Freq: Every day | ORAL | 1 refills | Status: DC
Start: 2023-05-01 — End: 2023-10-24

## 2023-05-01 MED ORDER — LOSARTAN POTASSIUM 50 MG PO TABS
50.0000 mg | ORAL_TABLET | Freq: Every day | ORAL | 0 refills | Status: DC
Start: 2023-05-01 — End: 2023-05-01

## 2023-05-01 NOTE — Patient Instructions (Signed)
Will order sleep study to do at home Get labs today Increased losartan to 100mg  daily

## 2023-05-02 ENCOUNTER — Encounter: Payer: Self-pay | Admitting: Physician Assistant

## 2023-05-02 ENCOUNTER — Other Ambulatory Visit: Payer: Self-pay | Admitting: Physician Assistant

## 2023-05-02 DIAGNOSIS — Z6837 Body mass index (BMI) 37.0-37.9, adult: Secondary | ICD-10-CM | POA: Insufficient documentation

## 2023-05-02 LAB — CMP14+EGFR
ALT: 34 [IU]/L (ref 0–44)
AST: 20 [IU]/L (ref 0–40)
Albumin: 4.3 g/dL (ref 4.1–5.1)
Alkaline Phosphatase: 71 [IU]/L (ref 44–121)
BUN/Creatinine Ratio: 10 (ref 9–20)
BUN: 11 mg/dL (ref 6–24)
Bilirubin Total: 0.7 mg/dL (ref 0.0–1.2)
CO2: 26 mmol/L (ref 20–29)
Calcium: 9.7 mg/dL (ref 8.7–10.2)
Chloride: 99 mmol/L (ref 96–106)
Creatinine, Ser: 1.13 mg/dL (ref 0.76–1.27)
Globulin, Total: 2.9 g/dL (ref 1.5–4.5)
Glucose: 115 mg/dL — ABNORMAL HIGH (ref 70–99)
Potassium: 4.4 mmol/L (ref 3.5–5.2)
Sodium: 140 mmol/L (ref 134–144)
Total Protein: 7.2 g/dL (ref 6.0–8.5)
eGFR: 81 mL/min/{1.73_m2} (ref >59)

## 2023-05-02 LAB — CBC WITH DIFFERENTIAL/PLATELET
Basophils Absolute: 0 10*3/uL (ref 0.0–0.2)
Basos: 1 %
EOS (ABSOLUTE): 0.1 10*3/uL (ref 0.0–0.4)
Eos: 1 %
Hematocrit: 46.2 % (ref 37.5–51.0)
Hemoglobin: 15.4 g/dL (ref 13.0–17.7)
Immature Grans (Abs): 0.1 10*3/uL (ref 0.0–0.1)
Immature Granulocytes: 1 %
Lymphocytes Absolute: 1.4 10*3/uL (ref 0.7–3.1)
Lymphs: 19 %
MCH: 29.8 pg (ref 26.6–33.0)
MCHC: 33.3 g/dL (ref 31.5–35.7)
MCV: 89 fL (ref 79–97)
Monocytes Absolute: 0.8 10*3/uL (ref 0.1–0.9)
Monocytes: 10 %
Neutrophils Absolute: 5.3 10*3/uL (ref 1.4–7.0)
Neutrophils: 68 %
Platelets: 249 10*3/uL (ref 150–450)
RBC: 5.17 x10E6/uL (ref 4.14–5.80)
RDW: 12.4 % (ref 11.6–15.4)
WBC: 7.7 10*3/uL (ref 3.4–10.8)

## 2023-05-02 LAB — LIPID PANEL
Chol/HDL Ratio: 8 {ratio} — ABNORMAL HIGH (ref 0.0–5.0)
Cholesterol, Total: 239 mg/dL — ABNORMAL HIGH (ref 100–199)
HDL: 30 mg/dL — ABNORMAL LOW (ref >39)
LDL Chol Calc (NIH): 146 mg/dL — ABNORMAL HIGH (ref 0–99)
Triglycerides: 339 mg/dL — ABNORMAL HIGH (ref 0–149)
VLDL Cholesterol Cal: 63 mg/dL — ABNORMAL HIGH (ref 5–40)

## 2023-05-02 NOTE — Progress Notes (Signed)
Luke Villarreal,   Elevated glucose.  (We need to add A1C to labs)   Cholesterol not to goal.  Your 10 year risk of cardiovascular risk is also elevated. I see Pitavastatin on list are you taking?   Marland KitchenMarland KitchenThe 10-year ASCVD risk score (Arnett DK, et al., 2019) is: 7.4%   Values used to calculate the score:     Age: 47 years     Sex: Male     Is Non-Hispanic African American: No     Diabetic: No     Tobacco smoker: No     Systolic Blood Pressure: 138 mmHg     Is BP treated: Yes     HDL Cholesterol: 30 mg/dL     Total Cholesterol: 239 mg/dL

## 2023-05-03 LAB — SPECIMEN STATUS REPORT

## 2023-05-03 LAB — HGB A1C W/O EAG: Hgb A1c MFr Bld: 6.1 % — ABNORMAL HIGH (ref 4.8–5.6)

## 2023-05-04 ENCOUNTER — Encounter: Payer: Self-pay | Admitting: Physician Assistant

## 2023-05-04 ENCOUNTER — Telehealth: Payer: Self-pay

## 2023-05-04 DIAGNOSIS — R7303 Prediabetes: Secondary | ICD-10-CM | POA: Insufficient documentation

## 2023-05-04 NOTE — Telephone Encounter (Signed)
 Initiated Prior authorization for:Livalo  4MG  tablets Via: Covermymeds Case/Key:[id number] Status: Pending as of 05/03/22 Reason: Notified Pt via: Mychart    Initiated Prior authorization for: Via: Covermymeds Case/Key: Status: Pending as of Reason: Notified Pt via: Mychart

## 2023-05-05 MED ORDER — METFORMIN HCL 500 MG PO TABS: 500.0000 mg | ORAL_TABLET | Freq: Two times a day (BID) | ORAL | 1 refills | Status: DC

## 2023-05-15 ENCOUNTER — Telehealth: Payer: BC Managed Care – PPO | Admitting: Physician Assistant

## 2023-05-15 VITALS — BP 117/74 | Ht 68.0 in | Wt 243.0 lb

## 2023-05-15 DIAGNOSIS — J301 Allergic rhinitis due to pollen: Secondary | ICD-10-CM

## 2023-05-15 DIAGNOSIS — F411 Generalized anxiety disorder: Secondary | ICD-10-CM

## 2023-05-15 DIAGNOSIS — I10 Essential (primary) hypertension: Secondary | ICD-10-CM

## 2023-05-15 DIAGNOSIS — H1013 Acute atopic conjunctivitis, bilateral: Secondary | ICD-10-CM

## 2023-05-15 DIAGNOSIS — F5101 Primary insomnia: Secondary | ICD-10-CM

## 2023-05-15 DIAGNOSIS — R7303 Prediabetes: Secondary | ICD-10-CM

## 2023-05-15 MED ORDER — MONTELUKAST SODIUM 10 MG PO TABS: 10.0000 mg | ORAL_TABLET | Freq: Every day | ORAL | 3 refills | Status: DC

## 2023-05-15 MED ORDER — MIRTAZAPINE 7.5 MG PO TABS: 7.5000 mg | ORAL_TABLET | Freq: Every day | ORAL | 1 refills | Status: DC

## 2023-05-15 MED ORDER — ALPRAZOLAM 1 MG PO TABS: ORAL_TABLET | ORAL | 5 refills | Status: DC

## 2023-05-15 NOTE — Progress Notes (Signed)
..  Virtual Visit via Video Note  I connected with Luke Villarreal on 05/16/23 at  3:20 PM EST by a video enabled telemedicine application and verified that I am speaking with the correct person using two identifiers.  Location: Patient: car Provider: clinic  .Participating in visit:  Patient: Luke Villarreal Provider: Vermell Bologna PA-C Provider in training: Vernell Gate PA-S   I discussed the limitations of evaluation and management by telemedicine and the availability of in person appointments. The patient expressed understanding and agreed to proceed.  History of Present Illness: Not able to do sleep study Sleeping well Needs some refills BP at home mostly good but with some bad readings    Observations/Objective: No acute distress  Normal breathing Normal mood and appearance  .SABRA Today's Vitals   05/15/23 1508  BP: 117/74  Weight: 243 lb (110.2 kg)  Height: 5' 8 (1.727 m)   Body mass index is 36.95 kg/m.    Assessment and Plan: SABRASABRAJearld was seen today for medical management of chronic issues.  Diagnoses and all orders for this visit:  Primary hypertension  Pre-diabetes  Primary insomnia -     mirtazapine  (REMERON ) 7.5 MG tablet; Take 1 tablet (7.5 mg total) by mouth at bedtime.  Anxiety state -     ALPRAZolam  (XANAX ) 1 MG tablet; TAKE 1 TABLET BY MOUTH AT BEDTIME, DO NOT TAKE WITH NORCO  Chronic allergic rhinitis due to pollen -     montelukast  (SINGULAIR ) 10 MG tablet; Take 1 tablet (10 mg total) by mouth at bedtime.  Allergic conjunctivitis of both eyes -     montelukast  (SINGULAIR ) 10 MG tablet; Take 1 tablet (10 mg total) by mouth at bedtime.   Pt has not been able to complete home sleep test Discussed lab testing, pt declined today Remeron  refilled  BP to goal, stay on same dosing Reviewed medication  Xanax  to refill in one month-sent post dated rx  Singulair  refilled for allergies    Follow Up Instructions:    I discussed the assessment  and treatment plan with the patient. The patient was provided an opportunity to ask questions and all were answered. The patient agreed with the plan and demonstrated an understanding of the instructions.   The patient was advised to call back or seek an in-person evaluation if the symptoms worsen or if the condition fails to improve as anticipated.   Omeed Osuna, PA-C

## 2023-05-16 ENCOUNTER — Encounter: Payer: Self-pay | Admitting: Physician Assistant

## 2023-06-12 ENCOUNTER — Encounter: Payer: Self-pay | Admitting: Physician Assistant

## 2023-06-13 ENCOUNTER — Telehealth: Payer: BC Managed Care – PPO

## 2023-06-13 ENCOUNTER — Telehealth: Payer: BC Managed Care – PPO | Admitting: Physician Assistant

## 2023-06-13 DIAGNOSIS — J208 Acute bronchitis due to other specified organisms: Secondary | ICD-10-CM

## 2023-06-13 MED ORDER — BENZONATATE 100 MG PO CAPS: 100.0000 mg | ORAL_CAPSULE | Freq: Three times a day (TID) | ORAL | 0 refills | Status: DC | PRN

## 2023-06-13 MED ORDER — BENZONATATE 200 MG PO CAPS: 200.0000 mg | ORAL_CAPSULE | Freq: Three times a day (TID) | ORAL | 0 refills | Status: DC | PRN

## 2023-06-13 MED ORDER — PROMETHAZINE-DM 6.25-15 MG/5ML PO SYRP: 5.0000 mL | ORAL_SOLUTION | Freq: Four times a day (QID) | ORAL | 0 refills | Status: DC | PRN

## 2023-06-13 NOTE — Progress Notes (Signed)
E-Visit for Cough   We are sorry that you are not feeling well.  Here is how we plan to help!  Based on your presentation I believe you most likely have A cough due to a virus.  This is called viral bronchitis and is best treated by rest, plenty of fluids and control of the cough.  You may use Ibuprofen or Tylenol as directed to help your symptoms.     In addition you may use A prescription cough medication called Tessalon Perles 100mg . You may take 1-2 capsules every 8 hours as needed for your cough.   From your responses in the eVisit questionnaire you describe inflammation in the upper respiratory tract which is causing a significant cough.  This is commonly called Bronchitis and has four common causes:   Allergies Viral Infections Acid Reflux Bacterial Infection Allergies, viruses and acid reflux are treated by controlling symptoms or eliminating the cause. An example might be a cough caused by taking certain blood pressure medications. You stop the cough by changing the medication. Another example might be a cough caused by acid reflux. Controlling the reflux helps control the cough.  USE OF BRONCHODILATOR ("RESCUE") INHALERS: There is a risk from using your bronchodilator too frequently.  The risk is that over-reliance on a medication which only relaxes the muscles surrounding the breathing tubes can reduce the effectiveness of medications prescribed to reduce swelling and congestion of the tubes themselves.  Although you feel brief relief from the bronchodilator inhaler, your asthma may actually be worsening with the tubes becoming more swollen and filled with mucus.  This can delay other crucial treatments, such as oral steroid medications. If you need to use a bronchodilator inhaler daily, several times per day, you should discuss this with your provider.  There are probably better treatments that could be used to keep your asthma under control.     HOME CARE Only take medications as  instructed by your medical team. Complete the entire course of an antibiotic. Drink plenty of fluids and get plenty of rest. Avoid close contacts especially the very young and the elderly Cover your mouth if you cough or cough into your sleeve. Always remember to wash your hands A steam or ultrasonic humidifier can help congestion.   GET HELP RIGHT AWAY IF: You develop worsening fever. You become short of breath You cough up blood. Your symptoms persist after you have completed your treatment plan MAKE SURE YOU  Understand these instructions. Will watch your condition. Will get help right away if you are not doing well or get worse.    Thank you for choosing an e-visit.  Your e-visit answers were reviewed by a board certified advanced clinical practitioner to complete your personal care plan. Depending upon the condition, your plan could have included both over the counter or prescription medications.  Please review your pharmacy choice. Make sure the pharmacy is open so you can pick up prescription now. If there is a problem, you may contact your provider through [employer] and have the prescription routed to another pharmacy.  Your safety is important to [place]. If you have drug allergies check your prescription carefully.   For the next 24 hours you can use MyChart to ask questions about today's visit, request a non-urgent call back, or ask for a work or school excuse. You will get an email in the next two days asking about your experience. I hope that your e-visit has been valuable and will speed your recovery.

## 2023-06-13 NOTE — Addendum Note (Signed)
Addended by: Waldon Merl on: 06/13/2023 07:51 AM   Modules accepted: Orders

## 2023-06-13 NOTE — Progress Notes (Signed)
I have spent 5 minutes in review of e-visit questionnaire, review and updating patient chart, medical decision making and response to patient.   Piedad Climes, PA-C

## 2023-07-31 ENCOUNTER — Other Ambulatory Visit: Payer: Self-pay | Admitting: Physician Assistant

## 2023-07-31 ENCOUNTER — Encounter: Payer: Self-pay | Admitting: Physician Assistant

## 2023-07-31 ENCOUNTER — Ambulatory Visit (INDEPENDENT_AMBULATORY_CARE_PROVIDER_SITE_OTHER): Payer: BC Managed Care – PPO | Admitting: Physician Assistant

## 2023-07-31 VITALS — BP 140/90 | HR 89 | Ht 68.0 in | Wt 224.0 lb

## 2023-07-31 DIAGNOSIS — M5136 Other intervertebral disc degeneration, lumbar region with discogenic back pain only: Secondary | ICD-10-CM

## 2023-07-31 DIAGNOSIS — G894 Chronic pain syndrome: Secondary | ICD-10-CM

## 2023-07-31 DIAGNOSIS — F410 Panic disorder [episodic paroxysmal anxiety] without agoraphobia: Secondary | ICD-10-CM | POA: Diagnosis not present

## 2023-07-31 DIAGNOSIS — I10 Essential (primary) hypertension: Secondary | ICD-10-CM

## 2023-07-31 DIAGNOSIS — Z0289 Encounter for other administrative examinations: Secondary | ICD-10-CM | POA: Diagnosis not present

## 2023-07-31 MED ORDER — HYDROCHLOROTHIAZIDE 12.5 MG PO TABS: 12.5000 mg | ORAL_TABLET | Freq: Every day | ORAL | 0 refills | Status: DC

## 2023-07-31 NOTE — Patient Instructions (Signed)
 Add hydrochlorothiazide in the morning to cozaar for blood pressure.  Nurse visit recheck in 2 weeks.

## 2023-08-02 MED ORDER — HYDROCODONE-ACETAMINOPHEN 5-325 MG PO TABS: ORAL_TABLET | ORAL | 0 refills | Status: DC

## 2023-08-04 ENCOUNTER — Encounter: Payer: Self-pay | Admitting: Physician Assistant

## 2023-08-04 NOTE — Progress Notes (Signed)
   Established Patient Office Visit  Subjective   Patient ID: Zuhayr Deeney, male    DOB: [birth year]  Age: 48 y.o. MRN: [id number]  Chief Complaint  Patient presents with   Medical Management of Chronic Issues    HPI Pt is a 48 yo male who presents to the clinic for 3 month medication refills. Pt is doing well. No concerns. Pt is not checking BP at home. Denies any CP, palpitations, headaches or vision changes.     ROS See HPI.    Objective:     BP (!) 140/90   Pulse 89   Ht 5\' 8"  (1.727 m)   Wt 224 lb (101.6 kg)   SpO2 99%   BMI 34.06 kg/m  BP Readings from Last 3 Encounters:  07/31/23 (!) 140/90  05/15/23 117/74  05/01/23 138/86   Wt Readings from Last 3 Encounters:  07/31/23 224 lb (101.6 kg)  05/15/23 243 lb (110.2 kg)  05/01/23 244 lb 4.8 oz (110.8 kg)      Physical Exam Constitutional:      Appearance: Normal appearance. He is obese.  HENT:     Head: Normocephalic.  Cardiovascular:     Rate and Rhythm: Normal rate and regular rhythm.  Pulmonary:     Effort: Pulmonary effort is normal.     Breath sounds: Normal breath sounds.  Musculoskeletal:     Right lower leg: No edema.     Left lower leg: No edema.  Neurological:     General: No focal deficit present.     Mental Status: He is alert and oriented to person, place, and time.  Psychiatric:        Mood and Affect: Mood normal.         Assessment & Plan:  Marland KitchenMarland KitchenAntowan was seen today for medical management of chronic issues.  Diagnoses and all orders for this visit:  Chronic pain syndrome -     HYDROcodone-acetaminophen (NORCO/VICODIN) 5-325 MG tablet; Take one tablet by mouth once a day. -     HYDROcodone-acetaminophen (NORCO/VICODIN) 5-325 MG tablet; Take one tablet as needed for moderate to severe pain. -     HYDROcodone-acetaminophen (NORCO/VICODIN) 5-325 MG tablet; Take one tablet once daily as needed for pain.  Pain management contract agreement -     Drug Screen 12+Alcohol+CRT,  Ur  Degeneration of intervertebral disc of lumbar region with discogenic back pain -     HYDROcodone-acetaminophen (NORCO/VICODIN) 5-325 MG tablet; Take one tablet by mouth once a day. -     HYDROcodone-acetaminophen (NORCO/VICODIN) 5-325 MG tablet; Take one tablet as needed for moderate to severe pain. -     HYDROcodone-acetaminophen (NORCO/VICODIN) 5-325 MG tablet; Take one tablet once daily as needed for pain.  Panic attacks  Primary hypertension -     hydrochlorothiazide (HYDRODIURIL) 12.5 MG tablet; Take 1 tablet (12.5 mg total) by mouth daily. In the morning for blood pressure.   Marland KitchenMarland KitchenPDMP reviewed during this encounter. Pain contract signed today UDS ordered Norco refilled Follow up in 3 month  BP not to goal Continue cozaar Added hydrochlorothiazide BP nurse visit recheck in 2 weeks   Return in about 3 months (around 10/30/2023) for 2 weeks nurse bp .    Tandy Gaw, PA-C

## 2023-08-14 ENCOUNTER — Ambulatory Visit (INDEPENDENT_AMBULATORY_CARE_PROVIDER_SITE_OTHER): Admitting: Physician Assistant

## 2023-08-14 VITALS — BP 129/79 | HR 86 | Resp 20

## 2023-08-14 DIAGNOSIS — I10 Essential (primary) hypertension: Secondary | ICD-10-CM | POA: Diagnosis not present

## 2023-08-14 NOTE — Progress Notes (Unsigned)
   Subjective:    Patient ID: Luke Villarreal, male    DOB: 12-15-1975, 48 y.o.   MRN: [id number]  HPI  Patient is here for blood pressure check. Denies trouble sleeping, palpitations or medication problem.   Review of Systems     Objective:   Physical Exam        Assessment & Plan:   Patient advised to keep follow up appointment with PCP.

## 2023-08-15 ENCOUNTER — Encounter: Payer: Self-pay | Admitting: Physician Assistant

## 2023-08-15 NOTE — Progress Notes (Signed)
 BP looks great. Stay on same BP medications and keep regular follow ups.

## 2023-08-30 NOTE — Telephone Encounter (Signed)
 This request has been handled. No further action is required. Please review other telephone encounters for additional information.

## 2023-08-30 NOTE — Telephone Encounter (Signed)
 Per insurance - PA was closed due to expiration. Inquires were not completed within the time frame. PA was resubmitted. #[id number]

## 2023-08-31 ENCOUNTER — Other Ambulatory Visit (HOSPITAL_COMMUNITY): Payer: Self-pay

## 2023-08-31 ENCOUNTER — Telehealth: Payer: Self-pay

## 2023-08-31 NOTE — Telephone Encounter (Signed)
 Pharmacy Patient Advocate Encounter  Received notification from [employer] [place]   that Prior Authorization for Pitavastatin  4 has been APPROVED from 08/01/23 to 08/30/24. Ran test claim, Copay is $0.00. This test claim was processed through [facility]- copay amounts may vary at other pharmacies due to pharmacy/plan contracts, or as the patient moves through the different stages of their insurance plan.

## 2023-09-15 ENCOUNTER — Ambulatory Visit: Admitting: Physician Assistant

## 2023-09-15 ENCOUNTER — Encounter: Payer: Self-pay | Admitting: Physician Assistant

## 2023-09-15 VITALS — BP 140/90 | HR 96 | Ht 68.0 in | Wt 219.0 lb

## 2023-09-15 DIAGNOSIS — E291 Testicular hypofunction: Secondary | ICD-10-CM

## 2023-09-15 DIAGNOSIS — E538 Deficiency of other specified B group vitamins: Secondary | ICD-10-CM | POA: Diagnosis not present

## 2023-09-15 DIAGNOSIS — G894 Chronic pain syndrome: Secondary | ICD-10-CM | POA: Diagnosis not present

## 2023-09-15 DIAGNOSIS — K649 Unspecified hemorrhoids: Secondary | ICD-10-CM

## 2023-09-15 DIAGNOSIS — F411 Generalized anxiety disorder: Secondary | ICD-10-CM

## 2023-09-15 DIAGNOSIS — R7303 Prediabetes: Secondary | ICD-10-CM

## 2023-09-15 DIAGNOSIS — M79604 Pain in right leg: Secondary | ICD-10-CM

## 2023-09-15 DIAGNOSIS — F410 Panic disorder [episodic paroxysmal anxiety] without agoraphobia: Secondary | ICD-10-CM | POA: Diagnosis not present

## 2023-09-15 DIAGNOSIS — F5101 Primary insomnia: Secondary | ICD-10-CM

## 2023-09-15 NOTE — Patient Instructions (Addendum)
 Stop metformin .  If BP running below 105/70 then cut losartan  in half but with BP in office I would stay on same dose.  Will make referral to GI for hemorrhoids  Ashwaganda 300mg  twice a day for anxiety Icy hot and heating pad on right leg B12 1000mcg daily

## 2023-09-15 NOTE — Progress Notes (Signed)
 Established Patient Office Visit  Subjective   Patient ID: Luke Villarreal, male    DOB: April 12, 1976  Age: 48 y.o. MRN: [id number]  Chief Complaint  Patient presents with   Medical Management of Chronic Issues    Mood, med evaluation,increased panic attacks, labs, rx refills, pt wants to check hormones , hemorrhoids flare ups, would like to get hemorrhoids banded     HPI Pt presents to the clinic with wife with multiple concerns today.   He is having more panic attacks for the last week. He thinks it is due to the stress of his sick mother. He is not on any daily medication for anxiety. He does not like the medications that you take daily.   He continues to have problems in his right upper thigh. He has a chronic blood clot there.   He is pre-diabetes and trying to maintain with diet and exercise and metformin . He does not like his metformin  because of the diarrhea and GI upset.  He is not checking his sugars at home. No CP, palpitations, headaches or vision changes.    Persistent hemrrhoids. Would like GI evaluation.     ROS See HPI.    Objective:     BP (!) 140/90   Pulse 96   Ht 5\' 8"  (1.727 m)   Wt 219 lb (99.3 kg)   SpO2 99%   BMI 33.30 kg/m  BP Readings from Last 3 Encounters:  09/15/23 (!) 140/90  08/14/23 129/79  07/31/23 (!) 140/90   Wt Readings from Last 3 Encounters:  09/15/23 219 lb (99.3 kg)  07/31/23 224 lb (101.6 kg)  05/15/23 243 lb (110.2 kg)      Physical Exam Constitutional:      Appearance: Normal appearance. He is obese.  HENT:     Head: Normocephalic.  Cardiovascular:     Rate and Rhythm: Normal rate and regular rhythm.  Pulmonary:     Effort: Pulmonary effort is normal.     Breath sounds: Normal breath sounds.  Musculoskeletal:     Comments: Slightly more swollen right than left. No warmth, tenderness or redness.   Neurological:     General: No focal deficit present.     Mental Status: He is alert and oriented to person, place, and  time.  Psychiatric:        Mood and Affect: Mood normal.        The 10-year ASCVD risk score (Arnett DK, et al., 2019) is: 19.5%    Assessment & Plan:  Luke Villarreal was seen today for medical management of chronic issues.  Diagnoses and all orders for this visit:  Panic attacks -     CMP14+EGFR -     TSH + free T4 -     VITAMIN D  25 Hydroxy (Vit-D Deficiency, Fractures) -     CBC w/Diff/Platelet  GAD (generalized anxiety disorder) -     CMP14+EGFR -     TSH + free T4 -     VITAMIN D  25 Hydroxy (Vit-D Deficiency, Fractures) -     CBC w/Diff/Platelet  Chronic pain syndrome -     CMP14+EGFR  Right leg pain -     CMP14+EGFR  B12 deficiency -     B12 and Folate Panel -     CMP14+EGFR -     CBC w/Diff/Platelet  Male hypogonadism -     CMP14+EGFR -     Testosterone  -     CBC w/Diff/Platelet  Pre-diabetes -  Hemoglobin A1c -     CMP14+EGFR -     CBC w/Diff/Platelet  Primary insomnia -     CMP14+EGFR  Hemorrhoids, unspecified hemorrhoid type   Will recheck labs.  Stop metformin  due to SE.  Cut losartan  in half due to low BP if occurring at home.  GI referral for hemorrhoids.  Failed creams and first line care.  Ashwaganda 300mg  bid for anxiety.  Start b12 1000mcg daily.  Icy hot gel and heating pad for right upper leg Consider hip flexor stretching as well.  Follow up in 2 months for pain follow up.      Kyrstin Campillo, PA-C

## 2023-09-16 LAB — CMP14+EGFR
ALT: 25 IU/L (ref 0–44)
AST: 19 IU/L (ref 0–40)
Albumin: 4.6 g/dL (ref 4.1–5.1)
Alkaline Phosphatase: 70 IU/L (ref 44–121)
BUN/Creatinine Ratio: 18 (ref 9–20)
BUN: 23 mg/dL (ref 6–24)
Bilirubin Total: 0.8 mg/dL (ref 0.0–1.2)
CO2: 21 mmol/L (ref 20–29)
Calcium: 9.4 mg/dL (ref 8.7–10.2)
Chloride: 93 mmol/L — ABNORMAL LOW (ref 96–106)
Creatinine, Ser: 1.29 mg/dL — ABNORMAL HIGH (ref 0.76–1.27)
Globulin, Total: 3 g/dL (ref 1.5–4.5)
Glucose: 85 mg/dL (ref 70–99)
Potassium: 4.1 mmol/L (ref 3.5–5.2)
Sodium: 135 mmol/L (ref 134–144)
Total Protein: 7.6 g/dL (ref 6.0–8.5)
eGFR: 69 mL/min/{1.73_m2} (ref >59)

## 2023-09-16 LAB — TESTOSTERONE: Testosterone: 266 ng/dL (ref 264–916)

## 2023-09-16 LAB — HEMOGLOBIN A1C
Est. average glucose Bld gHb Est-mCnc: 111 mg/dL
Hgb A1c MFr Bld: 5.5 % (ref 4.8–5.6)

## 2023-09-16 LAB — CBC WITH DIFFERENTIAL/PLATELET
Basophils Absolute: 0.1 10*3/uL (ref 0.0–0.2)
Basos: 1 %
EOS (ABSOLUTE): 0.1 10*3/uL (ref 0.0–0.4)
Eos: 1 %
Hematocrit: 47.5 % (ref 37.5–51.0)
Hemoglobin: 15.5 g/dL (ref 13.0–17.7)
Immature Grans (Abs): 0 10*3/uL (ref 0.0–0.1)
Immature Granulocytes: 0 %
Lymphocytes Absolute: 1.7 10*3/uL (ref 0.7–3.1)
Lymphs: 18 %
MCH: 29.4 pg (ref 26.6–33.0)
MCHC: 32.6 g/dL (ref 31.5–35.7)
MCV: 90 fL (ref 79–97)
Monocytes Absolute: 0.7 10*3/uL (ref 0.1–0.9)
Monocytes: 8 %
Neutrophils Absolute: 6.7 10*3/uL (ref 1.4–7.0)
Neutrophils: 72 %
Platelets: 256 10*3/uL (ref 150–450)
RBC: 5.28 x10E6/uL (ref 4.14–5.80)
RDW: 12.4 % (ref 11.6–15.4)
WBC: 9.2 10*3/uL (ref 3.4–10.8)

## 2023-09-16 LAB — TSH+FREE T4
Free T4: 1.52 ng/dL (ref 0.82–1.77)
TSH: 1.89 u[IU]/mL (ref 0.450–4.500)

## 2023-09-16 LAB — B12 AND FOLATE PANEL
Folate: 4.7 ng/mL (ref >3.0)
Vitamin B-12: 742 pg/mL (ref 232–1245)

## 2023-09-16 LAB — VITAMIN D 25 HYDROXY (VIT D DEFICIENCY, FRACTURES): Vit D, 25-Hydroxy: 27.3 ng/mL — ABNORMAL LOW (ref 30.0–100.0)

## 2023-09-18 ENCOUNTER — Ambulatory Visit: Payer: Self-pay | Admitting: Physician Assistant

## 2023-09-18 NOTE — Progress Notes (Signed)
 Vitamin D  still low. Make sure taking at least 2000 units daily.  A1C improved and in normal range! GREAT news.  B12 and folate look good. Start OTC 1000mcg and see if you can avoid having to get shots.  Thyroid looks good.  CBC looks great. Testosterone  in normal range but supplementation can be consider under 400 if having symptoms of low testosterone .

## 2023-09-20 ENCOUNTER — Other Ambulatory Visit: Payer: Self-pay | Admitting: Physician Assistant

## 2023-10-24 ENCOUNTER — Ambulatory Visit (INDEPENDENT_AMBULATORY_CARE_PROVIDER_SITE_OTHER): Admitting: Physician Assistant

## 2023-10-24 ENCOUNTER — Encounter: Payer: Self-pay | Admitting: Physician Assistant

## 2023-10-24 VITALS — BP 138/90 | HR 99 | Ht 68.0 in | Wt 219.0 lb

## 2023-10-24 DIAGNOSIS — G894 Chronic pain syndrome: Secondary | ICD-10-CM | POA: Diagnosis not present

## 2023-10-24 DIAGNOSIS — H1013 Acute atopic conjunctivitis, bilateral: Secondary | ICD-10-CM

## 2023-10-24 DIAGNOSIS — E66812 Obesity, class 2: Secondary | ICD-10-CM | POA: Diagnosis not present

## 2023-10-24 DIAGNOSIS — J301 Allergic rhinitis due to pollen: Secondary | ICD-10-CM

## 2023-10-24 DIAGNOSIS — E6609 Other obesity due to excess calories: Secondary | ICD-10-CM

## 2023-10-24 DIAGNOSIS — E538 Deficiency of other specified B group vitamins: Secondary | ICD-10-CM

## 2023-10-24 DIAGNOSIS — K219 Gastro-esophageal reflux disease without esophagitis: Secondary | ICD-10-CM

## 2023-10-24 DIAGNOSIS — I10 Essential (primary) hypertension: Secondary | ICD-10-CM

## 2023-10-24 DIAGNOSIS — M5136 Other intervertebral disc degeneration, lumbar region with discogenic back pain only: Secondary | ICD-10-CM

## 2023-10-24 DIAGNOSIS — F411 Generalized anxiety disorder: Secondary | ICD-10-CM

## 2023-10-24 DIAGNOSIS — Z6837 Body mass index (BMI) 37.0-37.9, adult: Secondary | ICD-10-CM

## 2023-10-24 DIAGNOSIS — H6991 Unspecified Eustachian tube disorder, right ear: Secondary | ICD-10-CM | POA: Insufficient documentation

## 2023-10-24 MED ORDER — LOSARTAN POTASSIUM 100 MG PO TABS: 100.0000 mg | ORAL_TABLET | Freq: Every day | ORAL | 1 refills | Status: DC

## 2023-10-24 MED ORDER — HYDROCODONE-ACETAMINOPHEN 5-325 MG PO TABS: ORAL_TABLET | ORAL | 0 refills | Status: DC

## 2023-10-24 MED ORDER — ESOMEPRAZOLE MAGNESIUM 40 MG PO CPDR: 40.0000 mg | DELAYED_RELEASE_CAPSULE | Freq: Every day | ORAL | 1 refills | Status: DC

## 2023-10-24 MED ORDER — LEVOCETIRIZINE DIHYDROCHLORIDE 5 MG PO TABS
5.0000 mg | ORAL_TABLET | Freq: Every evening | ORAL | 3 refills | Status: AC
Start: 2023-10-24 — End: ?

## 2023-10-24 MED ORDER — ALPRAZOLAM 1 MG PO TABS: ORAL_TABLET | ORAL | 5 refills | Status: DC

## 2023-10-24 MED ORDER — CYANOCOBALAMIN 1000 MCG/ML IJ SOLN: 1000.0000 ug | INTRAMUSCULAR | 0 refills | Status: AC

## 2023-10-24 MED ORDER — MONTELUKAST SODIUM 10 MG PO TABS: 10.0000 mg | ORAL_TABLET | Freq: Every day | ORAL | 3 refills | Status: AC

## 2023-10-24 MED ORDER — PAROXETINE HCL 10 MG PO TABS: 10.0000 mg | ORAL_TABLET | Freq: Every day | ORAL | 2 refills | Status: DC

## 2023-10-24 NOTE — Progress Notes (Unsigned)
 Established Patient Office Visit  Subjective   Patient ID: Luke Villarreal, male    DOB: 1976/01/18  Age: 48 y.o. MRN: [id number]  Chief Complaint  Patient presents with   Medical Management of Chronic Issues    Chronic pain syndrome,headaches anxiety       HPI Pt is a 48 yo male who presents to the clinic with his wife to discuss anxiety and to get norco refilled for chronic pain management.   Norco is doing well with no concerns. Chronic pain is controlled but continues to have issues with random pains that always concern him. He constantly think he is having a stroke or heart attack. His wife says he is so anxious. If he takes xanax  usually all symptoms resolve. Today on the way here he started having pain on left side in posterior neck that radiated up left side of head. It has now resolved.   He really think HcTZ made anxiety worse. He stopped it and BP at home is 120s over 70s when not anxious.   He never tried prozac despite being on allergy list because he was scared about taking it.   He does need allergy medication and nexium  refilled for GERD. No problems or concerns.     ROS See HPI.    Objective:     BP (!) 138/90   Pulse 99   Ht 5' 8 (1.727 m)   Wt 219 lb (99.3 kg)   SpO2 100%   BMI 33.30 kg/m  BP Readings from Last 3 Encounters:  10/24/23 (!) 138/90  09/15/23 (!) 140/90  08/14/23 129/79   Wt Readings from Last 3 Encounters:  10/24/23 219 lb (99.3 kg)  09/15/23 219 lb (99.3 kg)  07/31/23 224 lb (101.6 kg)      Physical Exam Constitutional:      Appearance: Normal appearance. He is obese.   Cardiovascular:     Rate and Rhythm: Normal rate.  Pulmonary:     Effort: Pulmonary effort is normal.   Musculoskeletal:     Comments: NROM of head No cervical spine tenderness to palpation Upper ext strength 5/5   Neurological:     Mental Status: He is alert and oriented to person, place, and time.   Psychiatric:     Comments: Anxious and  clamy palms        The 10-year ASCVD risk score (Arnett DK, et al., 2019) is: 8%    Assessment & Plan:  Luke Villarreal was seen today for medical management of chronic issues.  Diagnoses and all orders for this visit:  GAD (generalized anxiety disorder) -     PARoxetine (PAXIL) 10 MG tablet; Take 1 tablet (10 mg total) by mouth daily.  Chronic pain syndrome -     HYDROcodone -acetaminophen  (NORCO/VICODIN) 5-325 MG tablet; Take one tablet by mouth once a day. -     HYDROcodone -acetaminophen  (NORCO/VICODIN) 5-325 MG tablet; Take one tablet as needed for moderate to severe pain. -     HYDROcodone -acetaminophen  (NORCO/VICODIN) 5-325 MG tablet; Take one tablet once daily as needed for pain.  Class 2 obesity due to excess calories without serious comorbidity with body mass index (BMI) of 37.0 to 37.9 in adult  B12 deficiency -     cyanocobalamin  (VITAMIN B12) 1000 MCG/ML injection; Inject 1 mL (1,000 mcg total) into the muscle every 30 (thirty) days.  Primary hypertension -     losartan  (COZAAR ) 100 MG tablet; Take 1 tablet (100 mg total) by mouth daily.  ETD (Eustachian  tube dysfunction), right -     levocetirizine (XYZAL ) 5 MG tablet; Take 1 tablet (5 mg total) by mouth every evening.  Chronic allergic rhinitis due to pollen -     montelukast  (SINGULAIR ) 10 MG tablet; Take 1 tablet (10 mg total) by mouth at bedtime.  Allergic conjunctivitis of both eyes -     montelukast  (SINGULAIR ) 10 MG tablet; Take 1 tablet (10 mg total) by mouth at bedtime.  Anxiety state -     ALPRAZolam  (XANAX ) 1 MG tablet; TAKE 1 TABLET BY MOUTH AT BEDTIME, DO NOT TAKE WITH NORCO  Degeneration of intervertebral disc of lumbar region with discogenic back pain -     HYDROcodone -acetaminophen  (NORCO/VICODIN) 5-325 MG tablet; Take one tablet by mouth once a day. -     HYDROcodone -acetaminophen  (NORCO/VICODIN) 5-325 MG tablet; Take one tablet as needed for moderate to severe pain. -     HYDROcodone -acetaminophen   (NORCO/VICODIN) 5-325 MG tablet; Take one tablet once daily as needed for pain.  Gastroesophageal reflux disease without esophagitis -     esomeprazole  (NEXIUM ) 40 MG capsule; Take 1 capsule (40 mg total) by mouth daily.   Pt is very anxious and seemly effecting his overall health He never start prozac because he thought it was going to cause symptoms Discussed need for daily medication for anxiety Agreed to start paxil Discussed side effects and to give it at least 6 weeks to work Continue to take xanax  as needed but no more than once a day and NOT with norco.   ? If hydrochlorothiazide  made anxiety worse Stop for now Continue losaartan 100mg  daily Keep BP log at home since BP per patient are better at home  Xzyal refilled for allergies and ETD, controlled.   Nexium  refilled for GERD, controlled.   Norco sent for 3 months .SABRAPDMP reviewed during this encounter. No concerns Pain contract UTD UDS UTD Follow up in 3 months due to [place] law     Return in about 2 months (around 12/24/2023).    Sharmain Lastra, PA-C

## 2023-10-25 ENCOUNTER — Encounter: Payer: Self-pay | Admitting: Physician Assistant

## 2023-10-26 ENCOUNTER — Encounter: Payer: Self-pay | Admitting: Physician Assistant

## 2023-10-27 DIAGNOSIS — R200 Anesthesia of skin: Secondary | ICD-10-CM | POA: Diagnosis not present

## 2023-10-27 DIAGNOSIS — R0789 Other chest pain: Secondary | ICD-10-CM | POA: Diagnosis not present

## 2023-10-27 DIAGNOSIS — R519 Headache, unspecified: Secondary | ICD-10-CM | POA: Diagnosis not present

## 2023-10-27 DIAGNOSIS — Z888 Allergy status to other drugs, medicaments and biological substances status: Secondary | ICD-10-CM | POA: Diagnosis not present

## 2023-10-27 DIAGNOSIS — J45909 Unspecified asthma, uncomplicated: Secondary | ICD-10-CM | POA: Diagnosis not present

## 2023-10-27 DIAGNOSIS — E785 Hyperlipidemia, unspecified: Secondary | ICD-10-CM | POA: Diagnosis not present

## 2023-10-27 DIAGNOSIS — Z87891 Personal history of nicotine dependence: Secondary | ICD-10-CM | POA: Diagnosis not present

## 2023-10-27 DIAGNOSIS — Z885 Allergy status to narcotic agent status: Secondary | ICD-10-CM | POA: Diagnosis not present

## 2023-10-27 DIAGNOSIS — Z86718 Personal history of other venous thrombosis and embolism: Secondary | ICD-10-CM | POA: Diagnosis not present

## 2023-10-27 DIAGNOSIS — R079 Chest pain, unspecified: Secondary | ICD-10-CM | POA: Diagnosis not present

## 2023-10-27 DIAGNOSIS — Z7901 Long term (current) use of anticoagulants: Secondary | ICD-10-CM | POA: Diagnosis not present

## 2023-10-27 DIAGNOSIS — F419 Anxiety disorder, unspecified: Secondary | ICD-10-CM | POA: Diagnosis not present

## 2023-10-27 DIAGNOSIS — G8929 Other chronic pain: Secondary | ICD-10-CM | POA: Diagnosis not present

## 2023-10-27 DIAGNOSIS — F319 Bipolar disorder, unspecified: Secondary | ICD-10-CM | POA: Diagnosis not present

## 2023-10-30 ENCOUNTER — Ambulatory Visit: Payer: Self-pay

## 2023-10-30 NOTE — Telephone Encounter (Signed)
 FYI Only or Action Required?: FYI only for provider.  Patient was last seen in primary care on 10/24/2023 by Antoniette Vermell CROME, PA-C. Called Nurse Triage reporting Headache. Symptoms began 1.5 weeks. Interventions attempted: Prescription medications:  SABRA Symptoms are: gradually worsening.  Triage Disposition: See PCP When Office is Open (Within 3 Days)  Patient/caregiver understands and will follow disposition?: Yes   Copied from CRM [id number]. Topic: Clinical - Red Word Triage >> Oct 30, 2023  8:12 AM Rosaria A wrote: Red Word that prompted transfer to Nurse Triage: Patient is having headaches, dizziness, hands and feet sweating, no energy. Wanting an ultrasound on right leg and ct scan on head, having bad headaches on left side. Reason for Disposition  [1] MILD-MODERATE headache AND [2] present > 72 hours  [1] MILD weakness (i.e., does not interfere with ability to work, go to school, normal activities) AND [2] persists > 1 week  Answer Assessment - Initial Assessment Questions 1. LOCATION: Where does it hurt?      Back left side of head 2. ONSET: When did the headache start? (Minutes, hours or days)      1.5 week 3. PATTERN: Does the pain come and go, or has it been constant since it started?     constant 4. SEVERITY: How bad is the pain? and What does it keep you from doing?  (e.g., Scale 1-10; mild, moderate, or severe)   - MILD (1-3): doesn't interfere with normal activities    - MODERATE (4-7): interferes with normal activities or awakens from sleep    - SEVERE (8-10): excruciating pain, unable to do any normal activities        severe 5. RECURRENT SYMPTOM: Have you ever had headaches before? If Yes, ask: When was the last time? and What happened that time?      no 6. CAUSE: What do you think is causing the headache?     unknown 7. MIGRAINE: Have you been diagnosed with migraine headaches? If Yes, ask: Is this headache similar?      no 8. HEAD INJURY: Has  there been any recent injury to the head?      no 9. OTHER SYMPTOMS: Do you have any other symptoms? (fever, stiff neck, eye pain, sore throat, cold symptoms)     Dizziness, hands & feet sweating, and lack of energy 10. PREGNANCY: Is there any chance you are pregnant? When was your last menstrual period?       N/a  Pt's wife would like lab work done on husband and  ultrasound on right leg and ct scan on head,  Answer Assessment - Initial Assessment Questions 1. DESCRIPTION: Describe how you are feeling.     No energry 2. SEVERITY: How bad is it?  Can you stand and walk?   - MILD (0-3): Feels weak or tired, but does not interfere with work, school or normal activities.   - MODERATE (4-7): Able to stand and walk; weakness interferes with work, school, or normal activities.   - SEVERE (8-10): Unable to stand or walk; unable to do usual activities.     moderate 3. ONSET: When did these symptoms begin? (e.g., hours, days, weeks, months)     1.5 weeks 4. CAUSE: What do you think is causing the weakness or fatigue? (e.g., not drinking enough fluids, medical problem, trouble sleeping)     Unknown - possible anxiety 5. NEW MEDICINES:  Have you started on any new medicines recently? (e.g., opioid pain medicines, benzodiazepines, muscle  relaxants, antidepressants, antihistamines, neuroleptics, beta blockers)     Yes for anxiety and BP 6. OTHER SYMPTOMS: Do you have any other symptoms? (e.g., chest pain, fever, cough, SOB, vomiting, diarrhea, bleeding, other areas of pain)     N/a 7. PREGNANCY: Is there any chance you are pregnant? When was your last menstrual period?     N/a  Protocols used: Headache-A-AH, Weakness (Generalized) and Fatigue-A-AH

## 2023-10-30 NOTE — Telephone Encounter (Signed)
 Patient scheduled 10/31/2023 with Dr. Alvan

## 2023-10-30 NOTE — Telephone Encounter (Signed)
 Copied from CRM [id number]. Topic: Clinical - Red Word Triage >> Oct 30, 2023  8:07 AM Farrel B wrote: [facility] that prompted transfer to Nurse Triage: Patient's wife called in stating the patient was experiencing sweaty clamy hands, patient visited er on Friday, still experiencing some of the same symptoms, advised the spouse I was going to allow her to speak with the triage nurse she stated she didn't want to be placed on hold for a long period of time, I advised her I would seek assistance for her, but she disconnected call

## 2023-10-30 NOTE — Telephone Encounter (Signed)
 See triage note.

## 2023-10-31 ENCOUNTER — Encounter: Payer: Self-pay | Admitting: Family Medicine

## 2023-10-31 ENCOUNTER — Ambulatory Visit: Admitting: Family Medicine

## 2023-11-01 ENCOUNTER — Telehealth: Admitting: Family Medicine

## 2023-11-01 DIAGNOSIS — F419 Anxiety disorder, unspecified: Secondary | ICD-10-CM | POA: Diagnosis not present

## 2023-11-01 MED ORDER — HYDROXYZINE HCL 25 MG PO TABS: 25.0000 mg | ORAL_TABLET | Freq: Four times a day (QID) | ORAL | 0 refills | Status: DC | PRN

## 2023-11-01 NOTE — Patient Instructions (Signed)
 Luke Villarreal, thank you for joining Chiquita Luke Barefoot, NP for today's virtual visit.  While this provider is not your primary care provider (PCP), if your PCP is located in our provider database this encounter information will be shared with them immediately following your visit.   A [facility] MyChart account gives you access to today's visit and all your visits, tests, and labs performed at [facility]  click here if you don't have a [facility] MyChart account or go to [email or web address]  Consent: (Patient) Luke Villarreal provided verbal consent for this virtual visit at the beginning of the encounter.  Current Medications:  Current Outpatient Medications:    hydrOXYzine  (ATARAX ) 25 MG tablet, Take 1 tablet (25 mg total) by mouth every 6 (six) hours as needed for up to 12 days for anxiety., Disp: 30 tablet, Rfl: 0   ALPRAZolam  (XANAX ) 1 MG tablet, TAKE 1 TABLET BY MOUTH AT BEDTIME, DO NOT TAKE WITH NORCO, Disp: 30 tablet, Rfl: 5   cyanocobalamin  (VITAMIN B12) 1000 MCG/ML injection, Inject 1 mL (1,000 mcg total) into the muscle every 30 (thirty) days., Disp: 5 mL, Rfl: 0   esomeprazole  (NEXIUM ) 40 MG capsule, Take 1 capsule (40 mg total) by mouth daily., Disp: 90 capsule, Rfl: 1   [START ON 11/14/2023] HYDROcodone -acetaminophen  (NORCO/VICODIN) 5-325 MG tablet, Take one tablet by mouth once a day., Disp: 30 tablet, Rfl: 0   [START ON 12/14/2023] HYDROcodone -acetaminophen  (NORCO/VICODIN) 5-325 MG tablet, Take one tablet as needed for moderate to severe pain., Disp: 30 tablet, Rfl: 0   [START ON 01/13/2024] HYDROcodone -acetaminophen  (NORCO/VICODIN) 5-325 MG tablet, Take one tablet once daily as needed for pain., Disp: 30 tablet, Rfl: 0   levocetirizine (XYZAL ) 5 MG tablet, Take 1 tablet (5 mg total) by mouth every evening., Disp: 90 tablet, Rfl: 3   losartan  (COZAAR ) 100 MG tablet, Take 1 tablet (100 mg total) by mouth daily., Disp: 90 tablet, Rfl: 1   montelukast  (SINGULAIR ) 10 MG  tablet, Take 1 tablet (10 mg total) by mouth at bedtime., Disp: 90 tablet, Rfl: 3   PARoxetine  (PAXIL ) 10 MG tablet, Take 1 tablet (10 mg total) by mouth daily., Disp: 30 tablet, Rfl: 2   Medications ordered in this encounter:  Meds ordered this encounter  Medications   hydrOXYzine  (ATARAX ) 25 MG tablet    Sig: Take 1 tablet (25 mg total) by mouth every 6 (six) hours as needed for up to 12 days for anxiety.    Dispense:  30 tablet    Refill:  0    Supervising Provider:   LAMPTEY, PHILIP O [[id number]]     *If you need refills on other medications prior to your next appointment, please contact your pharmacy*  Follow-Up: Call back or seek an in-person evaluation if the symptoms worsen or if the condition fails to improve as anticipated.  [facility] [phone]  Other Instructions  Use this medication as needed for anxiety    If you have been instructed to have an in-person evaluation today at a local Urgent Care facility, please use the link below. It will take you to a list of all of our available [facility], including address, phone number and hours of operation. Please do not delay care.  [facility]  If you or a family member do not have a primary care provider, use the link below to schedule a visit and establish care. When you choose a [facility] primary care physician or advanced  practice provider, you gain a long-term partner in health. Find a Primary Care Provider  Learn more about [facility] in-office and virtual care options: [facility] - Get Care Now

## 2023-11-01 NOTE — Progress Notes (Signed)
 Virtual Visit Consent   Luke Villarreal, you are scheduled for a virtual visit with a [facility] provider today. Just as with appointments in the office, your consent must be obtained to participate. Your consent will be active for this visit and any virtual visit you may have with one of our providers in the next 365 days. If you have a MyChart account, a copy of this consent can be sent to you electronically.  As this is a virtual visit, video technology does not allow for your provider to perform a traditional examination. This may limit your provider's ability to fully assess your condition. If your provider identifies any concerns that need to be evaluated in person or the need to arrange testing (such as labs, EKG, etc.), we will make arrangements to do so. Although advances in technology are sophisticated, we cannot ensure that it will always work on either your end or our end. If the connection with a video visit is poor, the visit may have to be switched to a telephone visit. With either a video or telephone visit, we are not always able to ensure that we have a secure connection.  By engaging in this virtual visit, you consent to the provision of healthcare and authorize for your insurance to be billed (if applicable) for the services provided during this visit. Depending on your insurance coverage, you may receive a charge related to this service.  I need to obtain your verbal consent now. Are you willing to proceed with your visit today? Luke Villarreal has provided verbal consent on 11/01/2023 for a virtual visit (video or telephone). [name], NP  Date: 11/01/2023 3:01 PM   Virtual Visit via Video Note   I, [name], connected with  Luke Villarreal  ([id number], 30-Jun-1975) on 11/01/23 at  3:00 PM EDT by a video-enabled telemedicine application and verified that I am speaking with the correct person using two identifiers.  Location: Patient: Virtual Visit Location Patient:  Home Provider: Virtual Visit Location Provider: Home Office   I discussed the limitations of evaluation and management by telemedicine and the availability of in person appointments. The patient expressed understanding and agreed to proceed.    History of Present Illness: Luke Villarreal is a 48 y.o. who identifies as a male who was assigned male at birth, and is being seen today for anxiety.   Reports no new symptoms since being seen in ED.  Stopped Paxil  all together- due to it feeling like he was having a MI. Reports that anxiety was uncontrolled and so they started him the Vistaril  which he reports is working well.  And he was taking it every 6 hours since as directed on the bottle, but was only given 12 tablets. He has run out and his appt with his PCP 7/14 for follow up and he would like to have enough medication to last until then.    Problems:  Patient Active Problem List   Diagnosis Date Noted   ETD (Eustachian tube dysfunction), right 10/24/2023   Pre-diabetes 05/04/2023   Class 2 obesity due to excess calories without serious comorbidity with body mass index (BMI) of 37.0 to 37.9 in adult 05/02/2023   Witnessed episode of apnea 05/01/2023   At risk for obstructive sleep apnea 05/01/2023   Primary insomnia 05/01/2023   GAD (generalized anxiety disorder) 11/10/2022   Primary hypertension 11/10/2022   Elevated LDL cholesterol level 05/25/2022   Elevated blood pressure reading 05/24/2022   Acute pain of right  knee 05/24/2022   Lichen planus 02/03/2022   Milia 02/03/2022   Nausea 04/27/2021   Chronic pelvic pain in male 08/28/2020   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) 02/22/2020   History of DVT (deep vein thrombosis) 02/22/2020   Blood in stool 02/22/2020   Right leg pain 02/22/2020   Right ankle swelling 11/15/2019   Muscle cramps 11/15/2019   Chronic pain syndrome 08/18/2018   Lumbar degenerative disc disease 06/20/2018   Allergic conjunctivitis  of both eyes 06/20/2018   Chronic allergic rhinitis due to pollen 06/20/2018   Closed pelvic ring fracture (HCC) 03/20/2018   Sacral fracture, closed (HCC) 03/20/2018   Pelvic hematoma, male 03/20/2018   B12 deficiency 09/20/2016   Anxiety state 09/20/2016   Male hypogonadism 03/08/2016   Hypertriglyceridemia 11/26/2015   Low HDL (under 40) 11/26/2015   Anterolisthesis 11/26/2015   Pelvic pain 11/25/2015   Bilateral carpal tunnel syndrome 11/25/2015   Hematuria 11/25/2015   Gastroesophageal reflux disease without esophagitis 09/21/2015   Rhinitis, allergic 09/21/2015   Panic attacks 09/21/2015   Seasonal allergies 09/21/2015   Hemorrhoid 09/21/2015   History of asthma 09/21/2015    Allergies:  Allergies  Allergen Reactions   Buspar [Buspirone]     Makes feels weird   Lipitor [Atorvastatin ]     achy   Metformin  And Related     GI upset.    Prozac [Fluoxetine Hcl]     Dazed out and cannot function   Medications:  Current Outpatient Medications:    ALPRAZolam  (XANAX ) 1 MG tablet, TAKE 1 TABLET BY MOUTH AT BEDTIME, DO NOT TAKE WITH NORCO, Disp: 30 tablet, Rfl: 5   cyanocobalamin  (VITAMIN B12) 1000 MCG/ML injection, Inject 1 mL (1,000 mcg total) into the muscle every 30 (thirty) days., Disp: 5 mL, Rfl: 0   esomeprazole  (NEXIUM ) 40 MG capsule, Take 1 capsule (40 mg total) by mouth daily., Disp: 90 capsule, Rfl: 1   [START ON 11/14/2023] HYDROcodone -acetaminophen  (NORCO/VICODIN) 5-325 MG tablet, Take one tablet by mouth once a day., Disp: 30 tablet, Rfl: 0   [START ON 12/14/2023] HYDROcodone -acetaminophen  (NORCO/VICODIN) 5-325 MG tablet, Take one tablet as needed for moderate to severe pain., Disp: 30 tablet, Rfl: 0   [START ON 01/13/2024] HYDROcodone -acetaminophen  (NORCO/VICODIN) 5-325 MG tablet, Take one tablet once daily as needed for pain., Disp: 30 tablet, Rfl: 0   levocetirizine (XYZAL ) 5 MG tablet, Take 1 tablet (5 mg total) by mouth every evening., Disp: 90 tablet, Rfl: 3    losartan  (COZAAR ) 100 MG tablet, Take 1 tablet (100 mg total) by mouth daily., Disp: 90 tablet, Rfl: 1   montelukast  (SINGULAIR ) 10 MG tablet, Take 1 tablet (10 mg total) by mouth at bedtime., Disp: 90 tablet, Rfl: 3   PARoxetine  (PAXIL ) 10 MG tablet, Take 1 tablet (10 mg total) by mouth daily., Disp: 30 tablet, Rfl: 2  Observations/Objective: Patient is well-developed, well-nourished in no acute distress.  Resting comfortably  at home.  Head is normocephalic, atraumatic.  No labored breathing.  Speech is clear and coherent with logical content.  Patient is alert and oriented at baseline.    Assessment and Plan:   1. Anxiety (Primary)  - hydrOXYzine  (ATARAX ) 25 MG tablet; Take 1 tablet (25 mg total) by mouth every 6 (six) hours as needed for up to 12 days for anxiety.  Dispense: 30 tablet; Refill: 0   -denies SI/HI -no new symptoms since being on the medication doing well, just did not have enough to get  to this follow up appt. -advised to use as needed -keep follow up appt  Reviewed side effects, risks and benefits of medication.    Patient acknowledged agreement and understanding of the plan.   Past Medical, Surgical, Social History, Allergies, and Medications have been Reviewed.    Follow Up Instructions: I discussed the assessment and treatment plan with the patient. The patient was provided an opportunity to ask questions and all were answered. The patient agreed with the plan and demonstrated an understanding of the instructions.  A copy of instructions were sent to the patient via MyChart unless otherwise noted below.    The patient was advised to call back or seek an in-person evaluation if the symptoms worsen or if the condition fails to improve as anticipated.    [name], NP

## 2023-11-03 ENCOUNTER — Other Ambulatory Visit: Payer: Self-pay | Admitting: Physician Assistant

## 2023-11-08 ENCOUNTER — Encounter: Payer: Self-pay | Admitting: Physician Assistant

## 2023-11-08 ENCOUNTER — Encounter: Payer: Self-pay | Admitting: Family Medicine

## 2023-11-08 DIAGNOSIS — F419 Anxiety disorder, unspecified: Secondary | ICD-10-CM

## 2023-11-08 DIAGNOSIS — K625 Hemorrhage of anus and rectum: Secondary | ICD-10-CM | POA: Diagnosis not present

## 2023-11-08 MED ORDER — HYDROXYZINE HCL 25 MG PO TABS
25.0000 mg | ORAL_TABLET | Freq: Four times a day (QID) | ORAL | 0 refills | Status: DC | PRN
Start: 2023-11-08 — End: 2023-11-13

## 2023-11-08 NOTE — Telephone Encounter (Signed)
 Requesting rx rf of hydroxyzine  25mg   Last written 11/01/2023 for 12 days by Chiquita Barefoot Last OV 10/24/2023 Upcoming appt 11/13/2023 Please see attached message from patient.

## 2023-11-13 ENCOUNTER — Ambulatory Visit (INDEPENDENT_AMBULATORY_CARE_PROVIDER_SITE_OTHER): Admitting: Physician Assistant

## 2023-11-13 ENCOUNTER — Encounter: Payer: Self-pay | Admitting: Physician Assistant

## 2023-11-13 VITALS — BP 125/85 | HR 85 | Ht 68.0 in | Wt 219.0 lb

## 2023-11-13 DIAGNOSIS — R252 Cramp and spasm: Secondary | ICD-10-CM

## 2023-11-13 DIAGNOSIS — F410 Panic disorder [episodic paroxysmal anxiety] without agoraphobia: Secondary | ICD-10-CM

## 2023-11-13 DIAGNOSIS — R7301 Impaired fasting glucose: Secondary | ICD-10-CM | POA: Insufficient documentation

## 2023-11-13 DIAGNOSIS — F411 Generalized anxiety disorder: Secondary | ICD-10-CM

## 2023-11-13 DIAGNOSIS — F419 Anxiety disorder, unspecified: Secondary | ICD-10-CM

## 2023-11-13 MED ORDER — HYDROXYZINE HCL 25 MG PO TABS: ORAL_TABLET | ORAL | 2 refills | Status: DC

## 2023-11-13 MED ORDER — METFORMIN HCL ER 500 MG PO TB24: 500.0000 mg | ORAL_TABLET | Freq: Every day | ORAL | 3 refills | Status: DC

## 2023-11-13 MED ORDER — CYCLOBENZAPRINE HCL 10 MG PO TABS: 10.0000 mg | ORAL_TABLET | Freq: Three times a day (TID) | ORAL | 1 refills | Status: DC | PRN

## 2023-11-13 NOTE — Patient Instructions (Signed)
 Continue hydroxyzine  as needed up to 4 times a day but try to cut back to three times a day over the next 3 months.  Metformin  and flexeril  refilled.

## 2023-11-13 NOTE — Progress Notes (Unsigned)
   Established Patient Office Visit  Subjective   Patient ID: Luke Villarreal, male    DOB: 18-Nov-1975  Age: 48 y.o. MRN: [id number]  No chief complaint on file.   HPI  {History (Optional):23778}  ROS    Objective:     There were no vitals taken for this visit. {Vitals History (Optional):23777}  Physical Exam   No results found for any visits on 11/13/23.  {Labs (Optional):23779}  The 10-year ASCVD risk score (Arnett DK, et al., 2019) is: 20.6%    Assessment & Plan:   Problem List Items Addressed This Visit   None   No follow-ups on file.    Timeka Goette, PA-C

## 2023-11-14 ENCOUNTER — Encounter: Payer: Self-pay | Admitting: Physician Assistant

## 2023-12-01 ENCOUNTER — Encounter: Payer: Self-pay | Admitting: Physician Assistant

## 2024-01-09 ENCOUNTER — Other Ambulatory Visit: Payer: Self-pay | Admitting: Physician Assistant

## 2024-01-09 DIAGNOSIS — R252 Cramp and spasm: Secondary | ICD-10-CM

## 2024-02-07 ENCOUNTER — Other Ambulatory Visit: Payer: Self-pay | Admitting: Physician Assistant

## 2024-02-07 DIAGNOSIS — F410 Panic disorder [episodic paroxysmal anxiety] without agoraphobia: Secondary | ICD-10-CM

## 2024-02-07 DIAGNOSIS — F411 Generalized anxiety disorder: Secondary | ICD-10-CM

## 2024-02-09 ENCOUNTER — Ambulatory Visit (INDEPENDENT_AMBULATORY_CARE_PROVIDER_SITE_OTHER): Admitting: Physician Assistant

## 2024-02-09 VITALS — BP 131/80 | HR 80 | Ht 68.0 in | Wt 219.0 lb

## 2024-02-09 DIAGNOSIS — M5136 Other intervertebral disc degeneration, lumbar region with discogenic back pain only: Secondary | ICD-10-CM | POA: Diagnosis not present

## 2024-02-09 DIAGNOSIS — G894 Chronic pain syndrome: Secondary | ICD-10-CM | POA: Diagnosis not present

## 2024-02-09 DIAGNOSIS — F410 Panic disorder [episodic paroxysmal anxiety] without agoraphobia: Secondary | ICD-10-CM | POA: Diagnosis not present

## 2024-02-09 DIAGNOSIS — F411 Generalized anxiety disorder: Secondary | ICD-10-CM | POA: Diagnosis not present

## 2024-02-09 MED ORDER — ALPRAZOLAM 1 MG PO TABS: ORAL_TABLET | ORAL | 5 refills | Status: AC

## 2024-02-09 MED ORDER — HYDROCODONE-ACETAMINOPHEN 5-325 MG PO TABS: ORAL_TABLET | ORAL | 0 refills | Status: DC

## 2024-02-12 ENCOUNTER — Encounter: Payer: Self-pay | Admitting: Physician Assistant

## 2024-02-12 NOTE — Progress Notes (Signed)
 Established Patient Office Visit  Subjective   Patient ID: Luke Villarreal, male    DOB: May 28, 1975  Age: 48 y.o. MRN: [id number]  Chief Complaint  Patient presents with   Medical Management of Chronic Issues    HPI Discussed the use of AI scribe software for clinical note transcription with the patient, who gave verbal consent to proceed.  History of Present Illness Luke Villarreal is a 48 year old male with chronic pain and anxiety who presents for a three-month follow-up and medication refills.  Chronic pain and swelling - Pain is well controlled with current medication regimen - Manages swelling by wearing a sock most of the time, but did not wear one today - Uses Flexeril  as needed for muscle cramps - Continues to work despite pain  Anxiety and mood - Takes xanax  1mg  daily for anxiety.  - Takes hydroxyzine  as needed up to once daily for anxiety, with improvement in symptoms - Hydroxyzine  available for use more than once daily if needed - Due for a refill of Xanax , which is used for anxiety - No current anxiety symptoms - Mood is good  Antihypertensive therapy - Takes losartan  regularly - denies any CP, palpitations, headaches or vision changes.     ROS See HPI.    Objective:     BP 131/80   Pulse 80   Ht 5' 8 (1.727 m)   Wt 219 lb (99.3 kg)   SpO2 99%   BMI 33.30 kg/m  BP Readings from Last 3 Encounters:  02/09/24 131/80  11/13/23 125/85  10/24/23 (!) 138/90   Wt Readings from Last 3 Encounters:  02/09/24 219 lb (99.3 kg)  11/13/23 219 lb (99.3 kg)  10/24/23 219 lb (99.3 kg)   ..    02/09/2024    2:46 PM 11/13/2023    4:04 PM 10/24/2023    2:42 PM 09/15/2023    2:02 PM  GAD 7 : Generalized Anxiety Score  Nervous, Anxious, on Edge 0 0 1 1  Control/stop worrying 0 0 1 0  Worry too much - different things 0 1 1 0  Trouble relaxing 0 1 1 0  Restless 0 0 1 0  Easily annoyed or irritable 0 0 0 0  Afraid - awful might happen 0 0 1 2  Total GAD 7  Score 0 2 6 3   Anxiety Difficulty Not difficult at all Not difficult at all Not difficult at all Somewhat difficult      Physical Exam Constitutional:      Appearance: Normal appearance.  HENT:     Head: Normocephalic.  Cardiovascular:     Rate and Rhythm: Normal rate and regular rhythm.  Pulmonary:     Effort: Pulmonary effort is normal.     Breath sounds: Normal breath sounds.  Neurological:     General: No focal deficit present.     Mental Status: He is alert and oriented to person, place, and time.  Psychiatric:        Mood and Affect: Mood normal.      The 10-year ASCVD risk score (Arnett DK, et al., 2019) is: 17.5%    Assessment & Plan:  Luke Villarreal was seen today for medical management of chronic issues.  Diagnoses and all orders for this visit:  Chronic pain syndrome -     HYDROcodone -acetaminophen  (NORCO/VICODIN) 5-325 MG tablet; Take one tablet as needed for moderate to severe pain. -     HYDROcodone -acetaminophen  (NORCO/VICODIN) 5-325 MG tablet; Take one tablet once daily  as needed for pain. -     HYDROcodone -acetaminophen  (NORCO/VICODIN) 5-325 MG tablet; Take one tablet by mouth once a day. -     Drug Profile, Ur, 9 Drugs  GAD (generalized anxiety disorder)  Panic attacks  Anxiety state -     ALPRAZolam  (XANAX ) 1 MG tablet; TAKE 1 TABLET BY MOUTH AT BEDTIME, DO NOT TAKE WITH NORCO  Degeneration of intervertebral disc of lumbar region with discogenic back pain -     HYDROcodone -acetaminophen  (NORCO/VICODIN) 5-325 MG tablet; Take one tablet as needed for moderate to severe pain. -     HYDROcodone -acetaminophen  (NORCO/VICODIN) 5-325 MG tablet; Take one tablet once daily as needed for pain. -     HYDROcodone -acetaminophen  (NORCO/VICODIN) 5-325 MG tablet; Take one tablet by mouth once a day.   Assessment & Plan Chronic Pain due to lumbar DDD Pain well-controlled with medication. Residual swelling managed with sock. - Refill Norco for three months. - Ensure  pain contract is signed. - UDS ordered today - follow up in 3 months  .SABRAPDMP not reviewed this encounter.   Anxiety Disorder GAD-7 was 0.  Anxiety well-managed with hydroxyzine  and xanax . Failed numerous daily medications for anxiety such as zoloft  and lexapro.  - Refill Xanax . - Continue hydroxyzine  as needed for breakthrough anxiety.  Hypertension - BP to goal     Return in about 3 months (around 05/11/2024).    Kahliyah Dick, PA-C

## 2024-02-14 ENCOUNTER — Ambulatory Visit: Payer: Self-pay | Admitting: Physician Assistant

## 2024-02-14 LAB — DRUG PROFILE, UR, 9 DRUGS (LABCORP)
Amphetamines, Urine: NEGATIVE ng/mL
Barbiturate Quant, Ur: NEGATIVE ng/mL
Cocaine (Metab.): NEGATIVE ng/mL
Creatinine, Urine: 241.9 mg/dL (ref 20.0–300.0)
Methadone Screen, Urine: NEGATIVE ng/mL
Nitrite Urine, Quantitative: NEGATIVE ug/mL
OPIATE SCREEN URINE: NEGATIVE ng/mL
PCP Quant, Ur: NEGATIVE ng/mL
Propoxyphene: NEGATIVE ng/mL
pH, Urine: 5.2 (ref 4.5–8.9)

## 2024-02-14 LAB — CANNABINOID CONFIRMATION, UR
CANNABINOIDS: POSITIVE — AB
Carboxy THC GC/MS Conf: 97 ng/mL

## 2024-02-14 LAB — DRUG PROFILE 799031: BENZODIAZEPINES: NEGATIVE

## 2024-02-14 NOTE — Progress Notes (Signed)
 Drug screening today shows. Negative for norco which you are given regularly and positive for cannabinoids which breaks our pain contract. Can you explain this?

## 2024-02-22 NOTE — Progress Notes (Signed)
 Result Letter placed in mail to patient.

## 2024-02-24 ENCOUNTER — Encounter: Payer: Self-pay | Admitting: Physician Assistant

## 2024-04-07 ENCOUNTER — Ambulatory Visit
Admission: RE | Admit: 2024-04-07 | Discharge: 2024-04-07 | Disposition: A | Discharge status: Home / Self Care | Attending: Family Medicine | Admitting: Family Medicine

## 2024-04-07 ENCOUNTER — Other Ambulatory Visit: Payer: Self-pay

## 2024-04-07 VITALS — BP 139/89 | HR 79 | Temp 98.2°F | Resp 18 | Ht 68.0 in | Wt 220.0 lb

## 2024-04-07 DIAGNOSIS — L03313 Cellulitis of chest wall: Secondary | ICD-10-CM

## 2024-04-07 DIAGNOSIS — L02411 Cutaneous abscess of right axilla: Secondary | ICD-10-CM

## 2024-04-07 MED ORDER — CEFTRIAXONE SODIUM 1 G IJ SOLR
1000.0000 mg | Freq: Once | INTRAMUSCULAR | Status: AC
  Administered 2024-04-07: 1000 mg via INTRAMUSCULAR

## 2024-04-07 MED ORDER — DOXYCYCLINE HYCLATE 100 MG PO CAPS: 100.0000 mg | ORAL_CAPSULE | Freq: Two times a day (BID) | ORAL | 0 refills | Status: DC

## 2024-04-07 NOTE — Discharge Instructions (Signed)
 You want to keep a bandage until drainage stops The gauze in the opening will fall out in a day or 2 Take the doxycycline  antibiotic 2 times a day with food.  Start tonight May take Tylenol  or ibuprofen  as needed for pain Home from work tomorrow.  Call if you need additional time

## 2024-04-07 NOTE — ED Provider Notes (Signed)
 [facility]    CSN: [id number] Arrival date & time: 04/07/24  1341      History   Chief Complaint Chief Complaint  Patient presents with   Skin Ulcer    Needs to be lanced and drained - Entered by patient    HPI Luke Villarreal is a 48 y.o. male.   Patient has an abscess under his right axilla.  It started November 21.  It started as a small red lump but has gotten larger.  Now it hurts anytime he moves his arm.  The redness is spreading into his chest wall.  No fever chills or malaise.  Patient states he does not have diabetes, takes metformin  for prediabetes.    Past Medical History:  Diagnosis Date   Allergy    Anxiety    GERD (gastroesophageal reflux disease)     Patient Active Problem List   Diagnosis Date Noted   Elevated fasting glucose 11/13/2023   ETD (Eustachian tube dysfunction), right 10/24/2023   Pre-diabetes 05/04/2023   Class 2 obesity due to excess calories without serious comorbidity with body mass index (BMI) of 37.0 to 37.9 in adult 05/02/2023   Witnessed episode of apnea 05/01/2023   At risk for obstructive sleep apnea 05/01/2023   Primary insomnia 05/01/2023   GAD (generalized anxiety disorder) 11/10/2022   Primary hypertension 11/10/2022   Elevated LDL cholesterol level 05/25/2022   Elevated blood pressure reading 05/24/2022   Acute pain of right knee 05/24/2022   Lichen planus 02/03/2022   Milia 02/03/2022   Nausea 04/27/2021   Chronic pelvic pain in male 08/28/2020   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) 02/22/2020   History of DVT (deep vein thrombosis) 02/22/2020   Blood in stool 02/22/2020   Right leg pain 02/22/2020   Right ankle swelling 11/15/2019   Muscle cramps 11/15/2019   Chronic pain syndrome 08/18/2018   Lumbar degenerative disc disease 06/20/2018   Allergic conjunctivitis of both eyes 06/20/2018   Chronic allergic rhinitis due to pollen 06/20/2018   Closed pelvic ring fracture (HCC)  03/20/2018   Sacral fracture, closed (HCC) 03/20/2018   Pelvic hematoma, male 03/20/2018   B12 deficiency 09/20/2016   Anxiety state 09/20/2016   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 Authorizing Provider  doxycycline  (VIBRAMYCIN ) 100 MG capsule Take 1 capsule (100 mg total) by mouth 2 (two) times daily. 04/07/24  Yes Maranda Jamee Jacob, MD  ALPRAZolam  (XANAX ) 1 MG tablet TAKE 1 TABLET BY MOUTH AT BEDTIME, DO NOT TAKE WITH NORCO 02/09/24   Breeback, Jade L, PA-C  cyanocobalamin  (VITAMIN B12) 1000 MCG/ML injection Inject 1 mL (1,000 mcg total) into the muscle every 30 (thirty) days. 10/24/23   Breeback, Jade L, PA-C  cyclobenzaprine  (FLEXERIL ) 10 MG tablet TAKE 1 TABLET BY MOUTH 3 TIMES DAILY AS NEEDED FOR MUSCLE SPASMS 01/09/24   Breeback, Jade L, PA-C  esomeprazole  (NEXIUM ) 40 MG capsule Take 1  capsule (40 mg total) by mouth daily. 10/24/23   Breeback, Jade L, PA-C  HYDROcodone -acetaminophen  (NORCO/VICODIN) 5-325 MG tablet Take one tablet as needed for moderate to severe pain. 02/09/24   Breeback, Jade L, PA-C  HYDROcodone -acetaminophen  (NORCO/VICODIN) 5-325 MG tablet Take one tablet once daily as needed for pain. 03/10/24   Breeback, Jade L, PA-C  HYDROcodone -acetaminophen  (NORCO/VICODIN) 5-325 MG tablet Take one tablet by mouth once a day. 04/09/24   Breeback, Jade L, PA-C  hydrOXYzine  (ATARAX ) 25 MG tablet TAKE 1 TABLET BY MOUTH 4 TIMES DAILY AS NEEDED FOR  ANXIETY 02/08/24   Breeback, Jade L, PA-C  levocetirizine (XYZAL ) 5 MG tablet Take 1 tablet (5 mg total) by mouth every evening. 10/24/23   Breeback, Jade L, PA-C  losartan  (COZAAR ) 100 MG tablet Take 1 tablet (100 mg total) by mouth daily. 10/24/23   Breeback, Jade L, PA-C  metFORMIN  (GLUCOPHAGE -XR) 500 MG 24 hr tablet Take 1 tablet (500 mg total) by mouth daily with breakfast. 11/13/23   Breeback, Jade L, PA-C  montelukast  (SINGULAIR ) 10 MG tablet Take 1 tablet (10 mg total) by mouth at bedtime. 10/24/23   Antoniette Vermell CROME, PA-C    Family History Family History  Problem Relation Age of Onset   Hyperlipidemia Mother    Hypertension Mother    Hypertension Father    Hyperlipidemia Father     Social History Social History   Tobacco Use   Smoking status: Former   Smokeless tobacco: Never  Substance Use Topics   Alcohol use: No    Alcohol/week: 0.0 standard drinks of alcohol     Allergies   Buspar [buspirone], Lipitor [atorvastatin ], Metformin  and related, Paxil  [paroxetine ], and Prozac [fluoxetine hcl]   Review of Systems Review of Systems See HPI  Physical Exam Triage Vital Signs ED Triage Vitals  Encounter Vitals Group     BP 04/07/24 1402 139/89     Girls Systolic BP Percentile --      Girls Diastolic BP Percentile --      Boys Systolic BP Percentile --      Boys Diastolic BP Percentile --      Pulse Rate 04/07/24 1402 79     Resp 04/07/24 1402 18     Temp 04/07/24 1402 98.2 F (36.8 C)     Temp src --      SpO2 04/07/24 1402 97 %     Weight 04/07/24 1408 220 lb (99.8 kg)     Height 04/07/24 1408 5' 8 (1.727 m)     Head Circumference --      Peak Flow --      Pain Score 04/07/24 1408 6     Pain Loc --      Pain Education --      Exclude from Growth Chart --    No data found.  Updated Vital Signs BP 139/89 (BP Location: Left Arm)   Pulse 79   Temp 98.2 F (36.8 C)   Resp 18   Ht 5' 8 (1.727 m)   Wt 99.8 kg   SpO2 97%   BMI 33.45 kg/m    Physical  Exam Constitutional:      General: He is not in acute distress.    Appearance: He is well-developed.  HENT:     Head: Normocephalic and atraumatic.  Eyes:     Conjunctiva/sclera: Conjunctivae normal.     Pupils: Pupils are equal, round, and reactive to light.  Cardiovascular:     Rate and Rhythm: Normal  rate.  Pulmonary:     Effort: Pulmonary effort is normal. No respiratory distress.  Musculoskeletal:        General: Normal range of motion.       Arms:     Cervical back: Normal range of motion.  Skin:    General: Skin is warm and dry.     Findings: Erythema and lesion present.  Neurological:     Mental Status: He is alert.      UC Treatments / Results  Labs (all labs ordered are listed, but only abnormal results are displayed) Labs Reviewed - No data to display  EKG   Radiology No results found.  Procedures Incision and Drainage  Date/Time: 04/07/2024 3:01 PM  Performed by: Maranda Jamee Jacob, MD Authorized by: Maranda Jamee Jacob, MD   Consent:    Consent obtained:  Verbal   Consent given by:  Patient   Risks, benefits, and alternatives were discussed: yes   Universal protocol:    Patient identity confirmed:  Verbally with patient and arm band Location:    Type:  Abscess   Location:  Trunk   Trunk location:  Chest Pre-procedure details:    Skin preparation:  Povidone-iodine Sedation:    Sedation type:  None Anesthesia:    Anesthesia method:  Local infiltration   Local anesthetic:  Lidocaine 1% WITH epi Procedure type:    Complexity:  Simple Procedure details:    Ultrasound guidance: no     Needle aspiration: no     Incision types:  Stab incision   Incision depth:  Subcutaneous   Wound management:  Probed and deloculated   Drainage:  Bloody and purulent   Drainage amount:  Moderate   Wound treatment:  Wound left open and drain placed Post-procedure details:    Procedure completion:  Tolerated  (including critical care time)  Medications  Ordered in UC Medications  cefTRIAXone  (ROCEPHIN ) injection 1,000 mg (has no administration in time range)    Initial Impression / Assessment and Plan / UC Course  I have reviewed the triage vital signs and the nursing notes.  Pertinent labs & imaging results that were available during my care of the patient were reviewed by me and considered in my medical decision making (see chart for details).     Wound care discussed.  Because of the rapidly spreading cellulitis I felt a Rocephin  injection was indicated.  He is to follow with doxycycline  for a week.  Follow-up with PCP next week if needed Final Clinical Impressions(s) / UC Diagnoses   Final diagnoses:  Abscess of right axilla  Cellulitis of chest wall     Discharge Instructions      You want to keep a bandage until drainage stops The gauze in the opening will fall out in a day or 2 Take the doxycycline  antibiotic 2 times a day with food.  Start tonight May take Tylenol  or ibuprofen  as needed for pain Home from work tomorrow.  Call if you need additional time   ED Prescriptions     Medication Sig Dispense Auth. Provider   doxycycline  (VIBRAMYCIN ) 100 MG capsule Take 1 capsule (100 mg total) by mouth 2 (two) times daily. 14 capsule Maranda Jamee Jacob, MD      I have reviewed the PDMP during this encounter.   Maranda Jamee Jacob, MD 04/07/24 [phone]

## 2024-04-07 NOTE — ED Triage Notes (Signed)
 Pt presenting with red, painful ,swollen area under right armpit with greenish  drainage x 2 weeks. Pt stated he has been taking Amoxicillin  prescribed previously, not related to present issue  which has not been effective.

## 2024-04-08 ENCOUNTER — Other Ambulatory Visit: Payer: Self-pay | Admitting: Physician Assistant

## 2024-04-08 ENCOUNTER — Telehealth: Payer: Self-pay

## 2024-04-08 DIAGNOSIS — R252 Cramp and spasm: Secondary | ICD-10-CM

## 2024-04-08 NOTE — Telephone Encounter (Signed)
LMTRC if any questions or concerns. 

## 2024-04-14 ENCOUNTER — Ambulatory Visit
Admission: EM | Admit: 2024-04-14 | Discharge: 2024-04-14 | Disposition: A | Discharge status: Home / Self Care | Attending: Family Medicine | Admitting: Family Medicine

## 2024-04-14 ENCOUNTER — Encounter: Payer: Self-pay | Admitting: Emergency Medicine

## 2024-04-14 DIAGNOSIS — L02411 Cutaneous abscess of right axilla: Secondary | ICD-10-CM

## 2024-04-14 DIAGNOSIS — L0291 Cutaneous abscess, unspecified: Secondary | ICD-10-CM

## 2024-04-14 MED ORDER — CEFADROXIL 500 MG PO CAPS: 500.0000 mg | ORAL_CAPSULE | Freq: Two times a day (BID) | ORAL | 0 refills | Status: DC

## 2024-04-14 NOTE — ED Provider Notes (Signed)
 Luke [facility]    CSN: [id number] Arrival date & time: 04/14/24  1426      History   Chief Complaint Chief Complaint  Patient presents with   Abscess    HPI Luke Villarreal is a 48 y.o. male.   HPI  Patient is here for abscess follow-up He has 2 concerns.  1, there is still a lump under the skin where the abscess existed and 2, he can feel a cord adjacent to the abscess and worries that it is a blood clot  Past Medical History:  Diagnosis Date   Allergy    Anxiety    GERD (gastroesophageal reflux disease)     Patient Active Problem List   Diagnosis Date Noted   Elevated fasting glucose 11/13/2023   ETD (Eustachian tube dysfunction), right 10/24/2023   Pre-diabetes 05/04/2023   Class 2 obesity due to excess calories without serious comorbidity with body mass index (BMI) of 37.0 to 37.9 in adult 05/02/2023   Witnessed episode of apnea 05/01/2023   At risk for obstructive sleep apnea 05/01/2023   Primary insomnia 05/01/2023   GAD (generalized anxiety disorder) 11/10/2022   Primary hypertension 11/10/2022   Elevated LDL cholesterol level 05/25/2022   Elevated blood pressure reading 05/24/2022   Acute pain of right knee 05/24/2022   Lichen planus 02/03/2022   Milia 02/03/2022   Nausea 04/27/2021   Chronic pelvic pain in male 08/28/2020   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) 02/22/2020   History of DVT (deep vein thrombosis) 02/22/2020   Blood in stool 02/22/2020   Right leg pain 02/22/2020   Right ankle swelling 11/15/2019   Muscle cramps 11/15/2019   Chronic pain syndrome 08/18/2018   Lumbar degenerative disc disease 06/20/2018   Allergic conjunctivitis of both eyes 06/20/2018   Chronic allergic rhinitis due to pollen 06/20/2018   Closed pelvic ring fracture (HCC) 03/20/2018   Sacral fracture, closed (HCC) 03/20/2018   Pelvic hematoma, male 03/20/2018   B12 deficiency 09/20/2016   Anxiety state 09/20/2016   Male hypogonadism  03/08/2016   Hypertriglyceridemia 11/26/2015   Low HDL (under 40) 11/26/2015   Anterolisthesis 11/26/2015   Pelvic pain 11/25/2015   Bilateral carpal tunnel syndrome 11/25/2015   Hematuria 11/25/2015   Gastroesophageal reflux disease without esophagitis 09/21/2015   Panic attacks 09/21/2015   Seasonal allergies 09/21/2015   Hemorrhoid 09/21/2015   History of asthma 09/21/2015    Past Surgical History:  Procedure Laterality Date   FRACTURE SURGERY         Home Medications    Prior to Admission medications  Medication Sig Start Date End Date Taking? Authorizing Provider  ALPRAZolam  (XANAX ) 1 MG tablet TAKE 1 TABLET BY MOUTH AT BEDTIME, DO NOT TAKE WITH NORCO 02/09/24  Yes Breeback, Jade L, PA-C  cefadroxil  (DURICEF) 500 MG capsule Take 1 capsule (500 mg total) by mouth 2 (two) times daily. 04/14/24  Yes Maranda Jamee Jacob, MD  cyanocobalamin  (VITAMIN B12) 1000 MCG/ML injection Inject 1 mL (1,000 mcg total) into the muscle every 30 (thirty) days. 10/24/23  Yes Breeback, Jade L, PA-C  cyclobenzaprine  (FLEXERIL ) 10 MG tablet TAKE 1 TABLET BY MOUTH 3 TIMES DAILY AS NEEDED FOR MUSCLE SPASMS 04/09/24  Yes Breeback, Jade L, PA-C  doxycycline  (VIBRAMYCIN ) 100 MG capsule Take 1 capsule (100 mg total) by mouth 2 (two) times daily. 04/07/24  Yes Maranda Jamee Jacob, MD  esomeprazole  (NEXIUM ) 40 MG capsule Take 1 capsule (40 mg total) by mouth daily. 10/24/23  Yes Breeback, Jade L, PA-C  HYDROcodone -acetaminophen  (NORCO/VICODIN) 5-325 MG tablet Take one tablet as needed for moderate to severe pain. 02/09/24  Yes Breeback, Jade L, PA-C  HYDROcodone -acetaminophen  (NORCO/VICODIN) 5-325 MG tablet Take one tablet once daily as needed for pain. 03/10/24  Yes Breeback, Jade L, PA-C  HYDROcodone -acetaminophen  (NORCO/VICODIN) 5-325 MG tablet Take one tablet by mouth once a day. 04/09/24  Yes Breeback, Jade L, PA-C  hydrOXYzine  (ATARAX ) 25 MG tablet TAKE 1 TABLET BY MOUTH 4 TIMES DAILY AS NEEDED FOR ANXIETY  02/08/24  Yes Breeback, Jade L, PA-C  levocetirizine (XYZAL ) 5 MG tablet Take 1 tablet (5 mg total) by mouth every evening. 10/24/23  Yes Breeback, Jade L, PA-C  losartan  (COZAAR ) 100 MG tablet Take 1 tablet (100 mg total) by mouth daily. 10/24/23  Yes Breeback, Jade L, PA-C  metFORMIN  (GLUCOPHAGE -XR) 500 MG 24 hr tablet Take 1 tablet (500 mg total) by mouth daily with breakfast. 11/13/23  Yes Breeback, Jade L, PA-C  montelukast  (SINGULAIR ) 10 MG tablet Take 1 tablet (10 mg total) by mouth at bedtime. 10/24/23  Yes Antoniette Vermell CROME, PA-C    Family History Family History  Problem Relation Age of Onset   Hyperlipidemia Mother    Hypertension Mother    Hypertension Father    Hyperlipidemia Father     Social History Social History[1]   Allergies   Buspar [buspirone], Lipitor [atorvastatin ], Metformin  and related, Paxil  [paroxetine ], and Prozac [fluoxetine hcl]   Review of Systems Review of Systems See HPI  Physical Exam Triage Vital Signs ED Triage Vitals  Encounter Vitals Group     BP 04/14/24 1436 (!) 153/99     Girls Systolic BP Percentile --      Girls Diastolic BP Percentile --      Boys Systolic BP Percentile --      Boys Diastolic BP Percentile --      Pulse Rate 04/14/24 1436 71     Resp 04/14/24 1436 18     Temp 04/14/24 1436 98.9 F (37.2 C)     Temp Source 04/14/24 1436 Oral     SpO2 04/14/24 1436 98 %     Weight 04/14/24 1435 228 lb (103.4 kg)     Height 04/14/24 1435 5' 8 (1.727 m)     Head Circumference --      Peak Flow --      Pain Score 04/14/24 1435 0     Pain Loc --      Pain Education --      Exclude from Growth Chart --    No data found.  Updated Vital Signs BP (!) 153/99 (BP Location: Right Arm)   Pulse 71   Temp 98.9 F (37.2 C) (Oral)   Resp 18   Ht 5' 8 (1.727 m)   Wt 103.4 kg   SpO2 98%   BMI 34.67 kg/m       Physical Exam Constitutional:      General: He is not in acute distress.    Appearance: He is well-developed.  HENT:      Head: Normocephalic and atraumatic.  Eyes:     Conjunctiva/sclera: Conjunctivae normal.     Pupils: Pupils are equal, round, and reactive to light.  Cardiovascular:     Rate and Rhythm: Normal rate.  Pulmonary:     Effort: Pulmonary effort is normal. No respiratory distress.  Musculoskeletal:        General: Normal range of motion.     Cervical back: Normal  range of motion.  Skin:    General: Skin is warm and dry.     Findings: Lesion present.     Comments: In the right axilla there is a palpable nodule at the site of the abscess that measures 1 x 2 cm.  Nontender.  No fluctuance.  There is also a palpable cord superior to this that runs into the bicep muscle area.  Nontender.  Mobile  Neurological:     Mental Status: He is alert.      UC Treatments / Results  Labs (all labs ordered are listed, but only abnormal results are displayed) Labs Reviewed - No data to display  EKG   Radiology No results found.  Procedures Procedures (including critical Villarreal time)  Medications Ordered in UC Medications - No data to display  Initial Impression / Assessment and Plan / UC Course  I have reviewed the triage vital signs and the nursing notes.  Pertinent labs & imaging results that were available during my Villarreal of the patient were reviewed by me and considered in my medical decision making (see chart for details).     I explained to the patient that a superficial thrombophlebitis is not at all dangerous and he can stop worrying about the cord.  The knot will be there for some time, the induration from an infection takes a while to go away. Final Clinical Impressions(s) / UC Diagnoses   Final diagnoses:  Abscess  Abscess of axilla, right     Discharge Instructions      I am going to give you another week of antibiotics to make sure that this goes away I am going to give you a different antibiotic to cover additional germs Warm compresses to the area will help the lump  resolve Follow-up with Vermell at her next available appointment Call for problems   ED Prescriptions     Medication Sig Dispense Auth. Provider   cefadroxil  (DURICEF) 500 MG capsule Take 1 capsule (500 mg total) by mouth 2 (two) times daily. 14 capsule Maranda Jamee Jacob, MD      PDMP not reviewed this encounter.    [1]  Social History Tobacco Use   Smoking status: Former   Smokeless tobacco: Never  Substance Use Topics   Alcohol use: No    Alcohol/week: 0.0 standard drinks of alcohol     Maranda Jamee Jacob, MD 04/14/24 1511

## 2024-04-14 NOTE — ED Triage Notes (Signed)
 Patient was seen on 04/07/2024 for a drainage of an abscess under his right axilla.  Patient has completed the antibiotics but concern due to a knot still being there.  Denies any pain.

## 2024-04-14 NOTE — Discharge Instructions (Signed)
 I am going to give you another week of antibiotics to make sure that this goes away I am going to give you a different antibiotic to cover additional germs Warm compresses to the area will help the lump resolve Follow-up with Jade at her next available appointment Call for problems

## 2024-04-20 ENCOUNTER — Other Ambulatory Visit: Payer: Self-pay | Admitting: Physician Assistant

## 2024-04-20 DIAGNOSIS — I10 Essential (primary) hypertension: Secondary | ICD-10-CM

## 2024-04-29 ENCOUNTER — Encounter: Payer: Self-pay | Admitting: Physician Assistant

## 2024-05-06 ENCOUNTER — Other Ambulatory Visit: Payer: Self-pay | Admitting: Physician Assistant

## 2024-05-06 DIAGNOSIS — F411 Generalized anxiety disorder: Secondary | ICD-10-CM

## 2024-05-06 DIAGNOSIS — K219 Gastro-esophageal reflux disease without esophagitis: Secondary | ICD-10-CM

## 2024-05-06 DIAGNOSIS — F410 Panic disorder [episodic paroxysmal anxiety] without agoraphobia: Secondary | ICD-10-CM

## 2024-05-10 ENCOUNTER — Ambulatory Visit (INDEPENDENT_AMBULATORY_CARE_PROVIDER_SITE_OTHER): Admitting: Physician Assistant

## 2024-05-10 VITALS — BP 145/95

## 2024-05-10 DIAGNOSIS — I10 Essential (primary) hypertension: Secondary | ICD-10-CM | POA: Diagnosis not present

## 2024-05-10 DIAGNOSIS — G894 Chronic pain syndrome: Secondary | ICD-10-CM

## 2024-05-10 DIAGNOSIS — M5136 Other intervertebral disc degeneration, lumbar region with discogenic back pain only: Secondary | ICD-10-CM | POA: Diagnosis not present

## 2024-05-10 DIAGNOSIS — E7800 Pure hypercholesterolemia, unspecified: Secondary | ICD-10-CM

## 2024-05-10 DIAGNOSIS — R635 Abnormal weight gain: Secondary | ICD-10-CM | POA: Diagnosis not present

## 2024-05-10 DIAGNOSIS — R7301 Impaired fasting glucose: Secondary | ICD-10-CM

## 2024-05-10 DIAGNOSIS — H6991 Unspecified Eustachian tube disorder, right ear: Secondary | ICD-10-CM

## 2024-05-10 DIAGNOSIS — E559 Vitamin D deficiency, unspecified: Secondary | ICD-10-CM

## 2024-05-10 MED ORDER — METFORMIN HCL ER 500 MG PO TB24: 1000.0000 mg | ORAL_TABLET | Freq: Every day | ORAL | 3 refills | Status: AC

## 2024-05-10 MED ORDER — HYDROCODONE-ACETAMINOPHEN 5-325 MG PO TABS: ORAL_TABLET | ORAL | 0 refills | Status: AC

## 2024-05-10 MED ORDER — AMLODIPINE BESYLATE 2.5 MG PO TABS: 2.5000 mg | ORAL_TABLET | Freq: Every day | ORAL | 0 refills | Status: DC

## 2024-05-10 NOTE — Progress Notes (Unsigned)
" ° °  Established Patient Office Visit  Subjective   Patient ID: Luke Villarreal, male    DOB: 06/05/1975  Age: 49 y.o. MRN: [id number]  Chief Complaint  Patient presents with   Medical Management of Chronic Issues    HPI Discussed the use of AI scribe software for clinical note transcription with the patient, who gave verbal consent to proceed.  History of Present Illness    ROS See HPI.    Objective:     There were no vitals taken for this visit. BP Readings from Last 3 Encounters:  05/10/24 (!) 145/95  04/14/24 (!) 153/99  04/07/24 139/89   Wt Readings from Last 3 Encounters:  04/14/24 228 lb (103.4 kg)  04/07/24 220 lb (99.8 kg)  02/09/24 219 lb (99.3 kg)      Physical Exam   The 10-year ASCVD risk score (Arnett DK, et al., 2019) is: 22.4%    Assessment & Plan:  .Luke Villarreal was seen today for medical management of chronic issues.  Diagnoses and all orders for this visit:  Abnormal weight gain -     VITAMIN D  25 Hydroxy (Vit-D Deficiency, Fractures) -     CMP14+EGFR -     Lipid panel -     TSH + free T4  Chronic pain syndrome -     HYDROcodone -acetaminophen  (NORCO/VICODIN) 5-325 MG tablet; Take one tablet once daily as needed for pain. -     HYDROcodone -acetaminophen  (NORCO/VICODIN) 5-325 MG tablet; Take one tablet by mouth once a day. -     HYDROcodone -acetaminophen  (NORCO/VICODIN) 5-325 MG tablet; Take one tablet as needed for moderate to severe pain.  Degeneration of intervertebral disc of lumbar region with discogenic back pain -     HYDROcodone -acetaminophen  (NORCO/VICODIN) 5-325 MG tablet; Take one tablet once daily as needed for pain. -     HYDROcodone -acetaminophen  (NORCO/VICODIN) 5-325 MG tablet; Take one tablet by mouth once a day. -     HYDROcodone -acetaminophen  (NORCO/VICODIN) 5-325 MG tablet; Take one tablet as needed for moderate to severe pain.  Elevated fasting glucose -     metFORMIN  (GLUCOPHAGE -XR) 500 MG 24 hr tablet; Take 2 tablets (1,000  mg total) by mouth daily with breakfast. -     CMP14+EGFR  Primary hypertension -     CMP14+EGFR  Elevated LDL cholesterol level -     Lipid panel  Vitamin D  deficiency -     VITAMIN D  25 Hydroxy (Vit-D Deficiency, Fractures)  ETD (Eustachian tube dysfunction), right  Other orders -     amLODipine  (NORVASC ) 2.5 MG tablet; Take 1 tablet (2.5 mg total) by mouth daily.    Assessment & Plan      No follow-ups on file.    Luke Galdamez, PA-C  "

## 2024-05-10 NOTE — Patient Instructions (Signed)
 Added norvasc  2.5mg  daily to BP medications.  Increased metformin  to 2 500mg  tablets daily in the morning.

## 2024-05-11 LAB — LIPID PANEL
Chol/HDL Ratio: 5.3 ratio — ABNORMAL HIGH (ref 0.0–5.0)
Cholesterol, Total: 196 mg/dL (ref 100–199)
HDL: 37 mg/dL — ABNORMAL LOW
LDL Chol Calc (NIH): 134 mg/dL — ABNORMAL HIGH (ref 0–99)
Triglycerides: 140 mg/dL (ref 0–149)
VLDL Cholesterol Cal: 25 mg/dL (ref 5–40)

## 2024-05-11 LAB — CMP14+EGFR
ALT: 35 IU/L (ref 0–44)
AST: 23 IU/L (ref 0–40)
Albumin: 4.3 g/dL (ref 4.1–5.1)
Alkaline Phosphatase: 57 IU/L (ref 47–123)
BUN/Creatinine Ratio: 11 (ref 9–20)
BUN: 11 mg/dL (ref 6–24)
Bilirubin Total: 0.7 mg/dL (ref 0.0–1.2)
CO2: 25 mmol/L (ref 20–29)
Calcium: 9.3 mg/dL (ref 8.7–10.2)
Chloride: 100 mmol/L (ref 96–106)
Creatinine, Ser: 0.98 mg/dL (ref 0.76–1.27)
Globulin, Total: 3 g/dL (ref 1.5–4.5)
Glucose: 81 mg/dL (ref 70–99)
Potassium: 4.5 mmol/L (ref 3.5–5.2)
Sodium: 138 mmol/L (ref 134–144)
Total Protein: 7.3 g/dL (ref 6.0–8.5)
eGFR: 96 mL/min/1.73

## 2024-05-11 LAB — VITAMIN D 25 HYDROXY (VIT D DEFICIENCY, FRACTURES): Vit D, 25-Hydroxy: 36.3 ng/mL (ref 30.0–100.0)

## 2024-05-11 LAB — TSH+FREE T4
Free T4: 1.34 ng/dL (ref 0.82–1.77)
TSH: 1.66 u[IU]/mL (ref 0.450–4.500)

## 2024-05-13 ENCOUNTER — Encounter: Payer: Self-pay | Admitting: Physician Assistant

## 2024-05-13 ENCOUNTER — Ambulatory Visit: Payer: Self-pay | Admitting: Physician Assistant

## 2024-05-13 MED ORDER — ROSUVASTATIN CALCIUM 5 MG PO TABS: 5.0000 mg | ORAL_TABLET | Freq: Every day | ORAL | 3 refills | Status: AC

## 2024-05-13 NOTE — Progress Notes (Signed)
 Sent crestor  5mg  daily.

## 2024-05-13 NOTE — Progress Notes (Signed)
 Elspeth,   LDL, decreased some but not to goal.  TG to goal with a lot of improvement.  HDL, good cholesterol, is better.   Your 10 year cardiovascular risk is still high and my suggestion is to start a cholesterol lowering drug. Will you consider?   .The 10-year ASCVD risk score (Arnett DK, et al., 2019) is: 12.3%   Values used to calculate the score:     Age: 49 years     Clinically relevant sex: Male     Is Non-Hispanic African American: No     Diabetic: No     Tobacco smoker: Yes     Systolic Blood Pressure: 145 mmHg     Is BP treated: Yes     HDL Cholesterol: 37 mg/dL     Total Cholesterol: 196 mg/dL   Vitamin D  improved and in normal range.   Kidney function looks great.  Liver normal.  Thyroid and glucose look good.

## 2024-05-20 ENCOUNTER — Encounter: Payer: Self-pay | Admitting: Physician Assistant

## 2024-05-20 DIAGNOSIS — E559 Vitamin D deficiency, unspecified: Secondary | ICD-10-CM | POA: Insufficient documentation

## 2024-05-24 ENCOUNTER — Ambulatory Visit

## 2024-06-04 ENCOUNTER — Other Ambulatory Visit: Payer: Self-pay | Admitting: Physician Assistant

## 2024-06-04 ENCOUNTER — Other Ambulatory Visit: Payer: Self-pay

## 2024-06-04 ENCOUNTER — Encounter: Payer: Self-pay | Admitting: Physician Assistant

## 2024-06-04 MED ORDER — AMLODIPINE BESYLATE 2.5 MG PO TABS: 2.5000 mg | ORAL_TABLET | Freq: Every day | ORAL | 0 refills | Status: AC

## 2024-06-04 NOTE — Telephone Encounter (Signed)
" ° °  Just FYI_  Requesting rx rf of Amlodpine 2.5mg   Last written 05/10/2024 as 30 day supply Last OV 05/10/2024- notes read =Added 2.5 mg amlodipine  to regimen. - recheck BP in 2 weeks.  Spoke with patient - he was sick and had to cancel this appt previously but I have rescheduled him for Thursday 06/05/2024 nurse visit and verified that he does have enough Amlodipine  2.5mg  BP Medication to last until this appt .   "

## 2024-06-06 ENCOUNTER — Ambulatory Visit

## 2024-06-06 VITALS — BP 145/89 | HR 97 | Ht 68.0 in

## 2024-06-06 DIAGNOSIS — I10 Essential (primary) hypertension: Secondary | ICD-10-CM

## 2024-06-06 NOTE — Patient Instructions (Addendum)
 Increase Amlodipine  to 5mg  once daily and continue the Losartan  100mg  once dialy. Return in 2 weeks as nurse visit for BP check. Bring in home BP cuff for comparison and keep BP log once daily for next two weeks.

## 2024-06-06 NOTE — Progress Notes (Signed)
" ° °  Established Patient Office Visit  Subjective   Patient ID: Luke Villarreal, male    DOB: 1976-04-29  Age: 49 y.o. MRN: [id number]  Chief Complaint  Patient presents with   Hypertension    BP check nurse visit.     HPI  Hypertension- BP check nurse visit. Patient denies chest p ain, shortness of breath, palpitations, dizziness , vision changes or medication problems . Patient does state he has had lingering moslty dry cough ( when productive is clear mucus )  for about 3 weeks after recovering from unknown respiratory illness. ( States flu and covid test were negative) . Last BP 145/95 on 05/10/2024.patient questioning if he can take amlodipine  and Losartan  at the same time. Per Dr. Alvan this is fine to do  and patient was informed.   ROS    Objective:     BP (!) 145/89 (BP Location: Left Arm, Patient Position: Sitting, Cuff Size: Large) Comment: maual reading = 148/86  Pulse 97   Ht 5' 8 (1.727 m)   SpO2 98%   BMI 34.67 kg/m    Physical Exam   No results found for any visits on 06/06/24.    The 10-year ASCVD risk score (Arnett DK, et al., 2019) is: 12.3%    Assessment & Plan:  BP check- initial reading = 145/80. Second reading = 145/89 (manual reading = 148/86) - per DR. Metheney increase Amlodipine  to 5mg  once daily ( patient states he does have enough of the mediation to last until the 2 week follow up ) - recheck BP at nurse visit in 2 weeks.  Will check BP Once per day at home and will bring in his home cuff for comparison to next nurse visit appointment.  Problem List Items Addressed This Visit       Cardiovascular and Mediastinum   Primary hypertension - Primary    Return in about 2 weeks (around 06/20/2024) for nurse visit for BP check .    Suzen SHAUNNA Plenty, LPN  "

## 2024-06-20 ENCOUNTER — Ambulatory Visit

## 2024-08-09 ENCOUNTER — Ambulatory Visit: Admitting: Physician Assistant
